# Patient Record
Sex: Male | Born: 1975 | Race: White | Hispanic: No
Health system: Southern US, Community
[De-identification: ages and names within clinical notes are randomized; demographics above are authoritative.]

## PROBLEM LIST (undated history)

## (undated) DIAGNOSIS — T7840XA Allergy, unspecified, initial encounter: Secondary | ICD-10-CM

## (undated) DIAGNOSIS — J189 Pneumonia, unspecified organism: Secondary | ICD-10-CM

## (undated) DIAGNOSIS — B070 Plantar wart: Secondary | ICD-10-CM

## (undated) DIAGNOSIS — I10 Essential (primary) hypertension: Secondary | ICD-10-CM

## (undated) DIAGNOSIS — Z8669 Personal history of other diseases of the nervous system and sense organs: Secondary | ICD-10-CM

## (undated) DIAGNOSIS — E782 Mixed hyperlipidemia: Secondary | ICD-10-CM

## (undated) HISTORY — DX: Personal history of other diseases of the nervous system and sense organs: Z86.69

## (undated) HISTORY — DX: Allergy, unspecified, initial encounter: T78.40XA

## (undated) HISTORY — DX: Pneumonia, unspecified organism: J18.9

## (undated) HISTORY — PX: SHOULDER SURGERY: SHX246

## (undated) HISTORY — PX: CLAVICLE SURGERY: SHX598

## (undated) HISTORY — PX: NASAL FRACTURE SURGERY: SHX718

## (undated) HISTORY — DX: Mixed hyperlipidemia: E78.2

## (undated) HISTORY — DX: Plantar wart: B07.0

## (undated) HISTORY — PX: NOSE SURGERY: SHX723

---

## 2011-09-12 ENCOUNTER — Emergency Department
Admission: EM | Admit: 2011-09-12 | Discharge: 2011-09-12 | Disposition: A | Discharge status: Home / Self Care | Payer: 59 | Source: Home / Self Care | Attending: Emergency Medicine | Admitting: Emergency Medicine

## 2011-09-12 DIAGNOSIS — J329 Chronic sinusitis, unspecified: Secondary | ICD-10-CM

## 2011-09-12 DIAGNOSIS — J069 Acute upper respiratory infection, unspecified: Secondary | ICD-10-CM

## 2011-09-12 HISTORY — DX: Essential (primary) hypertension: I10

## 2011-09-12 MED ORDER — AZITHROMYCIN 250 MG PO TABS: ORAL_TABLET | ORAL | Status: AC

## 2011-09-12 MED ORDER — METHYLPREDNISOLONE SODIUM SUCC 125 MG IJ SOLR
125.0000 mg | Freq: Once | INTRAMUSCULAR | Status: AC
  Administered 2011-09-12: 125 mg via INTRAMUSCULAR

## 2011-09-12 NOTE — ED Provider Notes (Signed)
And a History     CSN: [id number]  Arrival date & time 09/12/11  1539   First MD Initiated Contact with Patient 09/12/11 1546      Chief Complaint  Patient presents with  . Facial Pain    (Consider location/radiation/quality/duration/timing/severity/associated sxs/prior treatment) HPI Albert Knight is a 36 y.o. male who complains of onset of cold symptoms for 10 days.  The symptoms are constant and mild-moderate in severity.  About 6 months ago he had similar symptoms and was prescribed Ceftin and developed a colitis. No sore throat + cough No pleuritic pain No wheezing + nasal congestion + post-nasal drainage ++ R sided sinus pain/pressure No chest congestion No itchy/red eyes No earache No hemoptysis No SOB No chills/sweats No fever No nausea No vomiting No abdominal pain No diarrhea No skin rashes No fatigue No myalgias + headache    Past Medical History  Diagnosis Date  . Hypertension     Past Surgical History  Procedure Date  . Shoulder surgery     No family history on file.  History  Substance Use Topics  . Smoking status: Not on file  . Smokeless tobacco: Not on file  . Alcohol Use:       Review of Systems  All other systems reviewed and are negative.    Allergies  Review of patient's allergies indicates no known allergies.  Home Medications   Current Outpatient Rx  Name Route Sig Dispense Refill  . AZITHROMYCIN 250 MG PO TABS  Use as directed 1 each 0    BP 140/86  Pulse 92  Temp(Src) 98.2 F (36.8 C) (Oral)  Resp 18  Ht 5\' 9"  (1.753 m)  Wt 224 lb (101.606 kg)  BMI 33.08 kg/m2  SpO2 97%  Physical Exam  Nursing note and vitals reviewed. Constitutional: He is oriented to person, place, and time. He appears well-developed and well-nourished.  HENT:  Head: Normocephalic and atraumatic.  Right Ear: Tympanic membrane, external ear and ear canal normal.  Left Ear: Tympanic membrane, external ear and ear canal normal.  Nose:  Mucosal edema and rhinorrhea present.  Mouth/Throat: Posterior oropharyngeal erythema present. No oropharyngeal exudate or posterior oropharyngeal edema.  Eyes: No scleral icterus.  Neck: Neck supple.  Cardiovascular: Regular rhythm and normal heart sounds.   Pulmonary/Chest: Effort normal and breath sounds normal. No respiratory distress.  Neurological: He is alert and oriented to person, place, and time.  Skin: Skin is warm and dry.  Psychiatric: He has a normal mood and affect. His speech is normal.    ED Course  Procedures (including critical care time)  Labs Reviewed - No data to display No results found.   1. Sinusitis   2. Acute upper respiratory infections of unspecified site       MDM  1)  Take the prescribed antibiotic as instructed.  Due to his history of colitis, I would prefer to treat him first with a shot of Solu-Medrol and over-the-counter cough and cold medicines.  It is not getting better, I gave her prescription for Z-Pak that he can use.  He has been on a Z-Pak in the past and has not had any side effects. 2)  Use nasal saline solution (over the counter) at least 3 times a day. 3)  Use over the counter decongestants like Zyrtec-D every 12 hours as needed to help with congestion.  If you have hypertension, do not take medicines with sudafed.  4)  Can take tylenol every 6 hours or motrin  every 8 hours for pain or fever. 5)  Follow up with your primary doctor if no improvement in 5-7 days, sooner if increasing pain, fever, or new symptoms.        Marlaine Hind, MD 09/12/11 [phone]

## 2011-09-12 NOTE — ED Notes (Signed)
States sinus pressure and HA for a week and a half worse yesterday

## 2011-09-14 ENCOUNTER — Telehealth: Payer: Self-pay | Admitting: *Deleted

## 2011-09-14 NOTE — ED Notes (Signed)
Patient called requesting work excuse. He was seen 3 days ago for a sinusitis. He reports flushing and diaphoresis on day 2 but denies current flushing or fever. He is requesting to be excused from work through today, 09/14/11. I told him I could excuse him for the day he was seen and yesterday 09/13/11. He asked for today off as well, I asked him why he needed to be out today and he reported "my nose is running a lot and it's making the outside of my nose raw". I denied the excuse for today but faxed a work excuse for 09/12/11 - 09/13/11 to [phone] per patient request.

## 2013-04-05 ENCOUNTER — Ambulatory Visit (INDEPENDENT_AMBULATORY_CARE_PROVIDER_SITE_OTHER): Payer: 59 | Admitting: Family Medicine

## 2013-04-05 ENCOUNTER — Encounter: Payer: Self-pay | Admitting: Family Medicine

## 2013-04-05 VITALS — BP 137/82 | HR 71 | Temp 97.4°F | Ht 68.5 in | Wt 223.0 lb

## 2013-04-05 DIAGNOSIS — J302 Other seasonal allergic rhinitis: Secondary | ICD-10-CM

## 2013-04-05 DIAGNOSIS — R599 Enlarged lymph nodes, unspecified: Secondary | ICD-10-CM

## 2013-04-05 DIAGNOSIS — J309 Allergic rhinitis, unspecified: Secondary | ICD-10-CM

## 2013-04-05 DIAGNOSIS — R591 Generalized enlarged lymph nodes: Secondary | ICD-10-CM | POA: Insufficient documentation

## 2013-04-05 LAB — CBC WITH DIFFERENTIAL/PLATELET
Basophils Absolute: 0 10*3/uL (ref 0.0–0.1)
Basophils Relative: 0 % (ref 0–1)
Eosinophils Absolute: 0.1 10*3/uL (ref 0.0–0.7)
Eosinophils Relative: 1 % (ref 0–5)
HCT: 44.6 % (ref 39.0–52.0)
Hemoglobin: 15.7 g/dL (ref 13.0–17.0)
Lymphocytes Relative: 27 % (ref 12–46)
Lymphs Abs: 1.6 10*3/uL (ref 0.7–4.0)
MCH: 31.2 pg (ref 26.0–34.0)
MCHC: 35.2 g/dL (ref 30.0–36.0)
MCV: 88.5 fL (ref 78.0–100.0)
Monocytes Absolute: 0.6 10*3/uL (ref 0.1–1.0)
Monocytes Relative: 9 % (ref 3–12)
Neutro Abs: 3.8 10*3/uL (ref 1.7–7.7)
Neutrophils Relative %: 63 % (ref 43–77)
Platelets: 294 10*3/uL (ref 150–400)
RBC: 5.04 MIL/uL (ref 4.22–5.81)
RDW: 13.4 % (ref 11.5–15.5)
WBC: 6.1 10*3/uL (ref 4.0–10.5)

## 2013-04-05 MED ORDER — FLUTICASONE PROPIONATE 50 MCG/ACT NA SUSP: 2.0000 | Freq: Every day | NASAL | Status: DC

## 2013-04-05 NOTE — Progress Notes (Signed)
CC: Albert Knight is a 37 y.o. male is here for Establish Care   Subjective: HPI:  Requesting refill on nasal steroid he has been taking all through the fall does this on an annual basis. Treat symptoms including nasal congestion clear nasal discharge and itching of the nasal cavity. Symptoms are well controlled on fluticasone.  Complains of a mass in the left groin that has been present at least since Monday when he first noticed it. It is slightly tender and becomes more tender the more he manipulates it he is unsure about describing the size but does not feel that it has been enlarging. Denies any other known masses. Denies any current skin infections or genitourinary complaints nor family or personal history of colon cancer or colitis. Denies unintentional weight loss, fevers, chills, night sweats, nor trouble fighting infections. Denies bruising or bleeding abnormalities.   Review of Systems - General ROS: negative for - chills, fever, night sweats, weight gain or weight loss Ophthalmic ROS: negative for - decreased vision Psychological ROS: negative for - anxiety or depression ENT ROS: negative for - hearing change, nasal congestion, tinnitus or allergies Hematological and Lymphatic ROS: negative for - bleeding problems, bruising Breast ROS: negative Respiratory ROS: no cough, shortness of breath, or wheezing Cardiovascular ROS: no chest pain or dyspnea on exertion Gastrointestinal ROS: no abdominal pain, change in bowel habits, or black or bloody stools Genito-Urinary ROS: negative for - genital discharge, genital ulcers, incontinence or abnormal bleeding from genitals Musculoskeletal ROS: negative for - joint pain or muscle pain Neurological ROS: negative for - headaches or memory loss Dermatological ROS: negative for lumps, mole changes, rash and skin lesion changes  Past Medical History  Diagnosis Date  . Hypertension      No family history on file.   History  Substance Use  Topics  . Smoking status: Never Smoker   . Smokeless tobacco: Not on file  . Alcohol Use: Not on file     Objective: Filed Vitals:   04/05/13 1017  BP: 137/82  Pulse: 71  Temp: 97.4 F (36.3 C)    General: Alert and Oriented, No Acute Distress HEENT: Pupils equal, round, reactive to light. Conjunctivae clear.   Moist mucous membranes pharynx unremarkable no cervical lymphadenopathy nor supraclavicular palpable lymphadenopathy  Lungs: Clear to auscultation bilaterally, no wheezing/ronchi/rales.  Comfortable work of breathing. Good air movement. Cardiac: Regular rate and rhythm. Normal S1/S2.  No murmurs, rubs, nor gallops.   Genitourinary: Unremarkable scrotum and testes, right inguinal region unremarkable left inguinal region with a approximately half centimeter slightly tender superficial fixed nodule.  Without overlying or nearby skin changes. Mental Status: No depression, anxiety, nor agitation. Skin: Warm and dry.  Assessment & Plan: Jahni was seen today for establish care.  Diagnoses and associated orders for this visit:  Seasonal allergies - fluticasone (FLONASE) 50 MCG/ACT nasal spray; Place 2 sprays into both nostrils daily.  Lymphadenopathy - CBC with Differential    Discussed with patient that this is most likely benign lymph node however he seems quite anxious about it, routinely get CBC with differential however will offer ultrasound for better characterization of  Anatomy following blood work. Refills provided for fluticasone, controlled seasonal allergies   Return if symptoms worsen or fail to improve.

## 2013-04-06 ENCOUNTER — Telehealth: Payer: Self-pay | Admitting: Family Medicine

## 2013-04-06 ENCOUNTER — Ambulatory Visit (INDEPENDENT_AMBULATORY_CARE_PROVIDER_SITE_OTHER): Payer: 59

## 2013-04-06 DIAGNOSIS — R1909 Other intra-abdominal and pelvic swelling, mass and lump: Secondary | ICD-10-CM

## 2013-04-06 DIAGNOSIS — R1900 Intra-abdominal and pelvic swelling, mass and lump, unspecified site: Secondary | ICD-10-CM

## 2013-04-06 NOTE — Telephone Encounter (Signed)
Pt notified. Pt wanted to see if he could get his u/s done this afternoon.Carollee Herter from [facility] is going to call pt to schedule this afternoon

## 2013-04-06 NOTE — Telephone Encounter (Signed)
[name], Will you please let Mr. Albert Knight know that his blood cell counts were all normal.  I've placed an order for an ultrasound of his groin to confirm that his mass is a benign lymph node.  He should be gettting a call about scheduling in the next few days.

## 2013-05-03 ENCOUNTER — Encounter: Payer: Self-pay | Admitting: Family Medicine

## 2013-12-03 ENCOUNTER — Encounter: Payer: Self-pay | Admitting: Family Medicine

## 2013-12-03 ENCOUNTER — Ambulatory Visit (INDEPENDENT_AMBULATORY_CARE_PROVIDER_SITE_OTHER): Payer: 59 | Admitting: Family Medicine

## 2013-12-03 VITALS — BP 139/79 | HR 56 | Temp 98.1°F | Wt 196.0 lb

## 2013-12-03 DIAGNOSIS — H66001 Acute suppurative otitis media without spontaneous rupture of ear drum, right ear: Secondary | ICD-10-CM

## 2013-12-03 DIAGNOSIS — H66009 Acute suppurative otitis media without spontaneous rupture of ear drum, unspecified ear: Secondary | ICD-10-CM

## 2013-12-03 DIAGNOSIS — J029 Acute pharyngitis, unspecified: Secondary | ICD-10-CM

## 2013-12-03 MED ORDER — AZITHROMYCIN 250 MG PO TABS: ORAL_TABLET | ORAL | Status: AC

## 2013-12-03 NOTE — Progress Notes (Signed)
CC: Albert Knight is a 38 y.o. male is here for Sore Throat   Subjective: HPI:  Complains of sore throat, facial pressure beneath both eyes, nasal congestion, nonproductive cough and right ear pain that has been present since Wednesday of last week worsening on a daily basis. Slightly better with NyQuil or DayQuil, nothing else seems to make it better. Sick contacts at home including strep throat, viral upper respiratory illnesses. Symptoms are present all hours of the day. Denies fevers, chills, wheezing, shortness of breath, motor sensory disturbances   Review Of Systems Outlined In HPI  Past Medical History  Diagnosis Date  . Hypertension     Past Surgical History  Procedure Laterality Date  . Shoulder surgery     Family History  Problem Relation Age of Onset  . Hypertension Mother   . Hypertension Father   . Hyperlipidemia Mother   . Hyperlipidemia Father     History   Social History  . Marital Status: Married    Spouse Name: N/A    Number of Children: N/A  . Years of Education: N/A   Occupational History  . Not on file.   Social History Main Topics  . Smoking status: Never Smoker   . Smokeless tobacco: Not on file  . Alcohol Use: Not on file  . Drug Use: No  . Sexual Activity: Not on file   Other Topics Concern  . Not on file   Social History Narrative  . No narrative on file     Objective: BP 139/79  Pulse 56  Temp(Src) 98.1 F (36.7 C) (Oral)  Wt 196 lb (88.905 kg)  General: Alert and Oriented, No Acute Distress HEENT: Pupils equal, round, reactive to light. Conjunctivae clear.  External ears unremarkable, canals clear with intact TMs with appropriate landmarks however right tympanic membrane moderately erythematous.  Left Middle ear appears open without effusion right middle ear with opaque fluid. Pink inferior turbinates.  Moist mucous membranes, pharynx without inflammation nor lesions.  Neck supple without palpable lymphadenopathy nor abnormal  masses. Lungs: Clear to auscultation bilaterally, no wheezing/ronchi/rales.  Comfortable work of breathing. Good air movement. Extremities: No peripheral edema.  Strong peripheral pulses.  Mental Status: No depression, anxiety, nor agitation. Skin: Warm and dry.  Assessment & Plan: Albert Knight was seen today for sore throat.  Diagnoses and associated orders for this visit:  Acute pharyngitis, unspecified pharyngitis type - POCT rapid strep A - azithromycin (ZITHROMAX) 250 MG tablet; Take two tabs at once on day 1, then one tab daily on days 2-5.  Acute suppurative otitis media of right ear without spontaneous rupture of tympanic membrane, recurrence not specified    Strep test negative, exam suggestive of right otitis media likely due to an initial viral upper respiratory illness, start azithromycin since he reports colitis when he took what appeared to be either amoxicillin or cephalosporin in the past   Return if symptoms worsen or fail to improve.

## 2014-01-25 ENCOUNTER — Ambulatory Visit: Payer: 59 | Admitting: Family Medicine

## 2014-01-25 ENCOUNTER — Telehealth: Payer: Self-pay

## 2014-01-28 ENCOUNTER — Ambulatory Visit: Payer: 59 | Admitting: Family Medicine

## 2014-01-30 NOTE — Telephone Encounter (Signed)
Opened in error

## 2014-05-28 ENCOUNTER — Other Ambulatory Visit: Payer: Self-pay | Admitting: Family Medicine

## 2014-11-29 ENCOUNTER — Other Ambulatory Visit: Payer: Self-pay | Admitting: Family Medicine

## 2014-11-29 MED ORDER — FLUTICASONE PROPIONATE 50 MCG/ACT NA SUSP: 2.0000 | Freq: Every day | NASAL | Status: DC

## 2015-02-06 ENCOUNTER — Telehealth: Payer: Self-pay | Admitting: *Deleted

## 2015-02-06 ENCOUNTER — Encounter: Payer: Self-pay | Admitting: Osteopathic Medicine

## 2015-02-06 ENCOUNTER — Ambulatory Visit (INDEPENDENT_AMBULATORY_CARE_PROVIDER_SITE_OTHER): Payer: 59 | Admitting: Osteopathic Medicine

## 2015-02-06 ENCOUNTER — Telehealth: Payer: Self-pay

## 2015-02-06 VITALS — BP 130/78 | HR 46 | Temp 98.0°F | Ht 68.5 in | Wt 207.0 lb

## 2015-02-06 DIAGNOSIS — H9202 Otalgia, left ear: Secondary | ICD-10-CM | POA: Diagnosis not present

## 2015-02-06 DIAGNOSIS — H6992 Unspecified Eustachian tube disorder, left ear: Secondary | ICD-10-CM

## 2015-02-06 MED ORDER — ANTIPYRINE-BENZOCAINE 5.4-1.4 % OT SOLN: 3.0000 [drp] | OTIC | Status: DC | PRN

## 2015-02-06 NOTE — Telephone Encounter (Signed)
Called pt to inform them that that the rx for antipyrine-benzocaine (AURALGAN) otic solution in not available and that they should return to the office is ear pain get worse pre Dr. Lyn HollingsheadAlexander. No answer. No vm. Will call back later.

## 2015-02-06 NOTE — Progress Notes (Signed)
HPI: Albert Knight is a 39 y.o. male who [facility]  today for chief complaint of:  Chief Complaint  Patient presents with  . left ear pain    since Tues    . Location: L ear . Quality: sorenedd . Severity: moderate . Duration: 2 days, better today thany esterday . Assoc signs/symptoms: some sinus pressure on L, no visoin changes, no pain with chewing   Past medical, social and family history reviewed: Past Medical History  Diagnosis Date  . Hypertension    Past Surgical History  Procedure Laterality Date  . Shoulder surgery     Social History  Substance Use Topics  . Smoking status: Never Smoker   . Smokeless tobacco: Not on file  . Alcohol Use: Not on file   Family History  Problem Relation Age of Onset  . Hypertension Mother   . Hypertension Father   . Hyperlipidemia Mother   . Hyperlipidemia Father     Current Outpatient Prescriptions  Medication Sig Dispense Refill  . fluticasone (FLONASE) 50 MCG/ACT nasal spray Place 2 sprays into both nostrils daily. Follow up appointment needed for future refills. 16 g 0   No current facility-administered medications for this visit.   No Known Allergies    Review of Systems: CONSTITUTIONAL: Neg fever/chills, no unintentional weight changes HEAD/EYES/EARS/NOSE/THROAT: No headache/vision change or hearing change, no sore throat, (+) sinus pressure as per HPI RESPIRATORY: No cough/shortness of breath/wheeze MUSCULOSKELETAL: No myalgia/arthralgia NEUROLOGIC: No weakness/dizzines   Exam:  BP 130/78 mmHg  Pulse 46  Temp(Src) 98 F (36.7 C) (Oral)  Ht 5' 8.5" (1.74 m)  Wt 207 lb (93.895 kg)  BMI 31.01 kg/m2 Constitutional: VSS, see above. General Appearance: alert, well-developed, well-nourished, NAD Eyes: Normal lids and conjunctive, non-icteric sclera, PERRLA Ears, Nose, Mouth, Throat: Normal external inspection ears/nares/mouth/lips/gums, Normal TM bilaterally except  mild scarring but membranes are intact, MMM, posterior pharynx without erythema/exudate, normal TMJ movement bilaterally, mild tender sinuses maxillary on L Neck: No masses, trachea midline. No thyroid enlargement/tenderness/mass appreciated   No results found for this or any previous visit (from the past 72 hour(s)).    ASSESSMENT/PLAN:  Left ear pain - Plan: antipyrine-benzocaine (AURALGAN) otic solution  Eustachian tube disorder, left   Advised to like an infection, though may be working on "viral sinusitis. Advised continue Flonase, antihistamines, decongestants over-the-counter. Advised on symptoms to watch for, worsening pain, fever chills, worsening vision changes. Patient advised no need for antibiotics at this time but let us know if he is not feeling any better. Advised over-the-counter Tylenol/concerning for aches/pains.

## 2015-02-06 NOTE — Telephone Encounter (Signed)
Pt came into the office and states the ear drops that were rx'd earlier this am are on back order, and won't be available for six months. Pt needs another rx sent in please

## 2015-02-06 NOTE — Patient Instructions (Signed)
Recommend continue Flonase, add antihistamine (Claritin, Zyrtec, Benadryl) with decongestant (Claritin-D, Zyrtec-D, or add Sudafed to the antihistamine). Ear drops as needed for pain. If fever/chills, worse ear pain or ear drainage, any worsening of pain happens please come back to clinic for further evaluation.

## 2015-02-06 NOTE — Telephone Encounter (Signed)
Made another attempt to call pt regarding this. See phone note.

## 2015-02-06 NOTE — Telephone Encounter (Signed)
[facility] called notifying me that the rx for antipyrine-benzocaine (AURALGAN) otic solution is on backorder. I called several other pharmacies in the area and the all informed me of the same thing. Called [facility] and they informed me that antipyrine-benzocaine (AURALGAN) otic solution has been taken off the market due to ineffectiveness.

## 2015-02-06 NOTE — Telephone Encounter (Signed)
Please call patient: no alternative ear drop, we have already called several pharmacies. Can use Ibuprofen or Tylenol as discussed in the office. Return to clinic if ear is worse. Thanks.

## 2015-02-07 NOTE — Telephone Encounter (Signed)
Pt has been notified.

## 2015-02-07 NOTE — Telephone Encounter (Signed)
Pt notified and advised to take otc ibuprofen for pain and return to the office if he feels it necessary.

## 2015-03-26 ENCOUNTER — Encounter: Payer: Self-pay | Admitting: Family Medicine

## 2015-03-26 ENCOUNTER — Ambulatory Visit (INDEPENDENT_AMBULATORY_CARE_PROVIDER_SITE_OTHER): Payer: 59 | Admitting: Family Medicine

## 2015-03-26 VITALS — BP 125/87 | HR 53 | Wt 207.0 lb

## 2015-03-26 DIAGNOSIS — A499 Bacterial infection, unspecified: Secondary | ICD-10-CM

## 2015-03-26 DIAGNOSIS — J329 Chronic sinusitis, unspecified: Secondary | ICD-10-CM

## 2015-03-26 DIAGNOSIS — B9689 Other specified bacterial agents as the cause of diseases classified elsewhere: Secondary | ICD-10-CM

## 2015-03-26 MED ORDER — AMOXICILLIN-POT CLAVULANATE 500-125 MG PO TABS: ORAL_TABLET | ORAL | Status: AC

## 2015-03-26 NOTE — Progress Notes (Signed)
CC: Albert Knight is a 39 y.o. male is here for Sinusitis   Subjective: HPI:  Nasal congestion, facial pressure in the forehead and cheeks, postnasal drip present since Wednesday of last week. Slowly worsening. Symptoms are worse at night. No benefit from nasal saline washes or Flonase. Also has occasional nosebleed. Denies any cough, fever, chills, wheezing, bleeding elsewhere. Denies any bruising problems. Denies any confusion or headache.   Review Of Systems Outlined In HPI  Past Medical History  Diagnosis Date  . Hypertension     Past Surgical History  Procedure Laterality Date  . Shoulder surgery     Family History  Problem Relation Age of Onset  . Hypertension Mother   . Hypertension Father   . Hyperlipidemia Mother   . Hyperlipidemia Father     Social History   Social History  . Marital Status: Married    Spouse Name: N/A  . Number of Children: N/A  . Years of Education: N/A   Occupational History  . Not on file.   Social History Main Topics  . Smoking status: Never Smoker   . Smokeless tobacco: Not on file  . Alcohol Use: Not on file  . Drug Use: No  . Sexual Activity: Not on file   Other Topics Concern  . Not on file   Social History Narrative     Objective: BP 125/87 mmHg  Pulse 53  Wt 207 lb (93.895 kg)  General: Alert and Oriented, No Acute Distress HEENT: Pupils equal, round, reactive to light. Conjunctivae clear.  External ears unremarkable, canals clear with intact TMs with appropriate landmarks.  Middle ear appears open without effusion. Pink inferior turbinates with no blood vessel seen on the septum..  Moist mucous membranes, pharynx without inflammation nor lesions.  Neck supple without palpable lymphadenopathy nor abnormal masses. Lungs: Clear to auscultation bilaterally, no wheezing/ronchi/rales.  Comfortable work of breathing. Good air movement. Extremities: No peripheral edema.  Strong peripheral pulses.  Mental Status: No depression,  anxiety, nor agitation. Skin: Warm and dry.  Assessment & Plan: Albert Knight was seen today for sinusitis.  Diagnoses and all orders for this visit:  Bacterial sinusitis -     amoxicillin-clavulanate (AUGMENTIN) 500-125 MG tablet; Take one by mouth every 8 hours for ten total days.   Start Augmentin, encouraged to stop Flonase until 10 days of antibiotics are complete. Continue nasal saline washes. Consider humidifier.  No Follow-up on file.

## 2015-06-05 ENCOUNTER — Other Ambulatory Visit: Payer: Self-pay

## 2015-06-05 MED ORDER — FLUTICASONE PROPIONATE 50 MCG/ACT NA SUSP: 2.0000 | Freq: Every day | NASAL | Status: DC

## 2015-06-18 ENCOUNTER — Encounter: Payer: Self-pay | Admitting: Family Medicine

## 2015-06-18 ENCOUNTER — Ambulatory Visit (INDEPENDENT_AMBULATORY_CARE_PROVIDER_SITE_OTHER): Payer: 59 | Admitting: Family Medicine

## 2015-06-18 VITALS — BP 125/85 | HR 60 | Wt 217.0 lb

## 2015-06-18 DIAGNOSIS — Z0000 Encounter for general adult medical examination without abnormal findings: Secondary | ICD-10-CM

## 2015-06-18 DIAGNOSIS — Z23 Encounter for immunization: Secondary | ICD-10-CM | POA: Diagnosis not present

## 2015-06-18 DIAGNOSIS — M545 Low back pain: Secondary | ICD-10-CM

## 2015-06-18 LAB — COMPREHENSIVE METABOLIC PANEL
ALK PHOS: 44 U/L (ref 40–115)
ALT: 21 U/L (ref 9–46)
AST: 25 U/L (ref 10–40)
Albumin: 4.4 g/dL (ref 3.6–5.1)
BILIRUBIN TOTAL: 0.7 mg/dL (ref 0.2–1.2)
BUN: 16 mg/dL (ref 7–25)
CALCIUM: 9.3 mg/dL (ref 8.6–10.3)
CO2: 25 mmol/L (ref 20–31)
CREATININE: 0.97 mg/dL (ref 0.60–1.35)
Chloride: 106 mmol/L (ref 98–110)
GLUCOSE: 84 mg/dL (ref 65–99)
Potassium: 4.3 mmol/L (ref 3.5–5.3)
SODIUM: 140 mmol/L (ref 135–146)
Total Protein: 6.7 g/dL (ref 6.1–8.1)

## 2015-06-18 LAB — CBC
HCT: 43.3 % (ref 39.0–52.0)
Hemoglobin: 14.9 g/dL (ref 13.0–17.0)
MCH: 30 pg (ref 26.0–34.0)
MCHC: 34.4 g/dL (ref 30.0–36.0)
MCV: 87.3 fL (ref 78.0–100.0)
MPV: 9.3 fL (ref 8.6–12.4)
PLATELETS: 216 10*3/uL (ref 150–400)
RBC: 4.96 MIL/uL (ref 4.22–5.81)
RDW: 13.9 % (ref 11.5–15.5)
WBC: 3.8 10*3/uL — ABNORMAL LOW (ref 4.0–10.5)

## 2015-06-18 LAB — VITAMIN B12: VITAMIN B 12: 737 pg/mL (ref 200–1100)

## 2015-06-18 LAB — LIPID PANEL
CHOLESTEROL: 195 mg/dL (ref 125–200)
HDL: 57 mg/dL (ref >=40)
LDL Cholesterol: 120 mg/dL (ref <130)
Total CHOL/HDL Ratio: 3.4 Ratio (ref <=5.0)
Triglycerides: 91 mg/dL (ref <150)
VLDL: 18 mg/dL (ref <30)

## 2015-06-18 NOTE — Progress Notes (Signed)
CC: Albert Knight is a 40 y.o. male is here for Annual Exam; URI; and Back Pain   Subjective: HPI:  Colonoscopy: No current indication Prostate: Discussed screening risks/beneifts with patient today, no current indication  Influenza Vaccine: Up-to-date Pneumovax: No current indication Td/Tdap: Will receive booster today Zoster: (Start 41 yo)  Requesting complete physical exam. 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 Worse with sitting at his desk. Denies any radiation or any motor or sensory disturbances. He's had this for decades. Has not been getting better or worse since onset. Improved with chiropractor visits.  Review of Systems - General ROS: negative for - chills, fever, night sweats, weight gain or weight loss Ophthalmic ROS: negative for - decreased vision Psychological ROS: negative for - anxiety or depression ENT ROS: negative for - hearing change, nasal congestion, tinnitus or allergies Hematological and Lymphatic ROS: negative for - bleeding problems, bruising or swollen lymph nodes Breast ROS: negative Respiratory ROS: no cough, shortness of breath, or wheezing Cardiovascular ROS: no chest pain or dyspnea on exertion Gastrointestinal ROS: no abdominal pain, change in bowel habits, or black or bloody stools Genito-Urinary ROS: negative for - genital discharge, genital ulcers, incontinence or abnormal bleeding from genitals Musculoskeletal ROS: negative for - joint pain or muscle pain Neurological ROS: negative for - headaches or memory loss Dermatological ROS: negative for lumps, mole changes, rash and skin lesion changes  Past Medical History  Diagnosis Date  . Hypertension     Past Surgical History  Procedure Laterality Date  . Shoulder surgery     Family History  Problem Relation Age of Onset  . Hypertension Mother   . Hypertension Father   . Hyperlipidemia Mother   . Hyperlipidemia Father     Social History   Social History  . Marital Status: Married     Spouse Name: N/A  . Number of Children: N/A  . Years of Education: N/A   Occupational History  . Not on file.   Social History Main Topics  . Smoking status: Never Smoker   . Smokeless tobacco: Not on file  . Alcohol Use: Not on file  . Drug Use: No  . Sexual Activity: Not on file   Other Topics Concern  . Not on file   Social History Narrative     Objective: BP 125/85 mmHg  Pulse 60  Wt 217 lb (98.431 kg) General: No Acute Distress HEENT: Atraumatic, normocephalic, conjunctivae normal without scleral icterus.  No nasal discharge, hearing grossly intact, TMs with good landmarks bilaterally with no middle ear abnormalities, posterior pharynx clear without oral lesions. Neck: Supple, trachea midline, no cervical nor supraclavicular adenopathy. Pulmonary: Clear to auscultation bilaterally without wheezing, rhonchi, nor rales. Cardiac: Regular rate and rhythm.  No murmurs, rubs, nor gallops. No peripheral edema.  2+ peripheral pulses bilaterally. Abdomen: Bowel sounds normal.  No masses.  Non-tender without rebound.  Negative Murphy's sign. MSK: Grossly intact, no signs of weakness.  Full strength throughout upper and lower extremities.  Full ROM in upper and lower extremities.  No midline spinal tenderness. straightening of the lumbar spine, lack of lordosis  Neuro: Gait unremarkable, CN II-XII grossly intact.  C5-C6 Reflex 2/4 Bilaterally, L4 Reflex 2/4 Bilaterally.  Cerebellar function intact. Skin: No rashes. Psych: Alert and oriented to person/place/time.  Thought process normal. No anxiety/depression. Assessment & Plan: [name] was seen today for annual exam, uri and back pain.  Diagnoses and all orders for this visit:  Annual physical exam -  Lipid panel -     Comprehensive Metabolic Panel (CMET) -     CBC -     Vitamin B12 -     Tdap vaccine greater than or equal to 7yo IM  Low back pain without sciatica, unspecified back pain laterality   Healthy lifestyle  interventions including but not limited to regular exercise, a healthy low fat diet, moderation of salt intake, the dangers of tobacco/alcohol/recreational drug use, nutrition supplementation, and accident avoidance were discussed with the patient and a handout was provided for future reference.  Encouragd to get a lumbar foam support for his desk chair.  Return in about 1 year (around 06/17/2016).

## 2015-06-20 ENCOUNTER — Telehealth: Payer: Self-pay

## 2015-06-20 MED ORDER — AMOXICILLIN-POT CLAVULANATE 500-125 MG PO TABS: ORAL_TABLET | ORAL | Status: AC

## 2015-06-20 MED FILL — AMOX-CLAV 500-125 MG TABLET: 500-125 | 10 days supply | Qty: 30 | Fill #0

## 2015-06-20 NOTE — Telephone Encounter (Signed)
[place], Rx placed in in-box ready for pickup/faxing. Printed since I'm not sure which pharmacy he wants.

## 2015-06-20 NOTE — Telephone Encounter (Signed)
Pt stated that he is still not feeling better.  Please advise.

## 2015-06-20 NOTE — Telephone Encounter (Signed)
Rx faxed

## 2016-01-13 MED FILL — AZITHROMYCIN 250 MG TABLET: 250 | 5 days supply | Qty: 6 | Fill #0

## 2016-05-24 ENCOUNTER — Telehealth: Payer: Self-pay | Admitting: Family Medicine

## 2016-05-24 MED ORDER — OSELTAMIVIR PHOSPHATE 75 MG PO CAPS: 75.0000 mg | ORAL_CAPSULE | Freq: Every day | ORAL | 0 refills | Status: DC

## 2016-05-24 MED FILL — OSELTAMIVIR PHOS 75 MG CAP: 75 | 10 days supply | Qty: 10 | Fill #0

## 2016-05-24 NOTE — Telephone Encounter (Signed)
Patient doesn't have symptoms. Both his kids was diagnosed with the flu. Sent in Tamiflu to [facility].

## 2016-05-24 NOTE — Telephone Encounter (Signed)
Please send script to [facility].  Thank you.

## 2016-05-24 NOTE — Telephone Encounter (Signed)
Pt called. His kids tested positive for flu and both were given Tamaflu. Pt would like Tamaflu as well, his symptoms are runny nose & scratchy throat.

## 2016-07-26 MED FILL — AZITHROMYCIN 250 MG TABLET: 250 | 5 days supply | Qty: 6 | Fill #0

## 2016-12-23 ENCOUNTER — Telehealth: Payer: Self-pay | Admitting: *Deleted

## 2016-12-23 ENCOUNTER — Other Ambulatory Visit: Payer: Self-pay | Admitting: *Deleted

## 2016-12-23 MED ORDER — FLUTICASONE PROPIONATE 50 MCG/ACT NA SUSP: 2.0000 | Freq: Every day | NASAL | 0 refills | Status: DC

## 2016-12-23 NOTE — Telephone Encounter (Signed)
Pt called requesting a refill for his flonase. I explained to him that he has not been seen here at our office since 05/2015 and would need to come in to be seen to get established with a provider. Pt stated that he is pretty healthy and that his employer does his annual physical and labs so there is really no reason for him to come in to be seen. I told him that this medication can be purchased OTC and that if I were to send it to his mail order it would cost more since it would be a 30 day supply. I strongly advised pt to establish care with a provider he declined at this time and requested that the rx be sent. I told him that I would do it this time however this would be the only time this could be done.Loralee PacasBarkley, Ambree Frances Gene AutryLynetta

## 2017-03-19 ENCOUNTER — Encounter: Payer: Self-pay | Admitting: Emergency Medicine

## 2017-03-19 ENCOUNTER — Emergency Department (INDEPENDENT_AMBULATORY_CARE_PROVIDER_SITE_OTHER)
Admission: EM | Admit: 2017-03-19 | Discharge: 2017-03-19 | Disposition: A | Discharge status: Home / Self Care | Payer: 59 | Source: Home / Self Care | Attending: Family Medicine | Admitting: Family Medicine

## 2017-03-19 ENCOUNTER — Other Ambulatory Visit: Payer: Self-pay

## 2017-03-19 ENCOUNTER — Emergency Department (INDEPENDENT_AMBULATORY_CARE_PROVIDER_SITE_OTHER): Payer: 59

## 2017-03-19 DIAGNOSIS — J181 Lobar pneumonia, unspecified organism: Secondary | ICD-10-CM | POA: Diagnosis not present

## 2017-03-19 DIAGNOSIS — J189 Pneumonia, unspecified organism: Secondary | ICD-10-CM

## 2017-03-19 LAB — POCT CBC W AUTO DIFF (K'VILLE URGENT CARE)

## 2017-03-19 MED ORDER — AZITHROMYCIN 250 MG PO TABS: ORAL_TABLET | ORAL | 0 refills | Status: DC

## 2017-03-19 NOTE — Discharge Instructions (Addendum)
Take plain guaifenesin (1200mg extended release tabs such as Mucinex) twice daily, with plenty of water, for cough and congestion.  May add Pseudoephedrine (30mg, one or two every 4 to 6 hours) for sinus congestion.  Get adequate rest.   May use Afrin nasal spray (or generic oxymetazoline) each morning for about 5 days and then discontinue.  Also recommend using saline nasal spray several times daily and saline nasal irrigation (AYR is a common brand).  Use Flonase nasal spray each morning after using Afrin nasal spray and saline nasal irrigation. Try warm salt water gargles for sore throat.  Stop all antihistamines for now, and other non-prescription cough/cold preparations. May take Delsym Cough Suppressant at bedtime for nighttime cough.  

## 2017-03-19 NOTE — ED Triage Notes (Signed)
Chest Congestion, Cough x 1 week

## 2017-03-20 NOTE — ED Provider Notes (Signed)
[facility]    CSN: [id number] Arrival date & time: 03/19/17  1056     History   Chief Complaint Chief Complaint  Patient presents with  . Cough    HPI Albert Knight is a 41 y.o. male.   Six days ago patient developed typical cold-like symptoms developing over several days, including mild sore throat, sinus congestion, headache, fatigue, chills/sweats, and cough.  His ears have felt full.  He denies pleuritic pain or shortness of breath. Three days ago he had a negative flu test and received a Solumedrol injection.  His symptoms have not improved taking Mucinex, Daquil, and Nyquil. He has a history of seasonal rhinitis (spring and fall) and had pneumonia in his teens.   The history is provided by the patient.    Past Medical History:  Diagnosis Date  . Hypertension     Patient Active Problem List   Diagnosis Date Noted  . Lymphadenopathy 04/05/2013    Past Surgical History:  Procedure Laterality Date  . SHOULDER SURGERY         Home Medications    Prior to Admission medications   Medication Sig Start Date End Date Taking? Authorizing Provider  azithromycin (ZITHROMAX Z-PAK) 250 MG tablet Take 2 tabs today; then begin one tab once daily for 4 more days. 03/19/17   Lattie HawBeese, Stephen A, MD  fluticasone (FLONASE) 50 MCG/ACT nasal spray Place 2 sprays into both nostrils daily. LAST REFILL. YOU MUST ESTABLISH WITH A PCP FOR REFILLS 12/23/16   Monica Bectonhekkekandam, Thomas J, MD  fluticasone [facility](FLONASE) 50 MCG/ACT nasal spray Place 2 sprays into both nostrils daily. Follow up appointment needed for future refills. 12/23/16   Monica Bectonhekkekandam, Thomas J, MD    Family History Family History  Problem Relation Age of Onset  . Hypertension Mother   . Hyperlipidemia Mother   . Hypertension Father   . Hyperlipidemia Father     Social History Social History   Tobacco Use  . Smoking status: Never Smoker  . Smokeless tobacco: Never Used  Substance Use Topics  . Alcohol use:  Yes    Frequency: Never    Comment: 1-2 per month   . Drug use: No     Allergies   Patient has no known allergies.   Review of Systems Review of Systems + sore throat + cough No pleuritic pain ? wheezing + nasal congestion + post-nasal drainage No sinus pain/pressure No itchy/red eyes No earache No hemoptysis No SOB No fever, + chills/sweats No nausea No vomiting No abdominal pain No diarrhea No urinary symptoms No skin rash + fatigue No myalgias + headache Used OTC meds without relief   Physical Exam Triage Vital Signs ED Triage Vitals  Enc Vitals Group     BP 03/19/17 1123 123/79     Pulse Rate 03/19/17 1123 78     Resp 03/19/17 1123 16     Temp 03/19/17 1123 (!) 97.3 F (36.3 C)     Temp Source 03/19/17 1123 Oral     SpO2 03/19/17 1123 98 %     Weight 03/19/17 1126 185 lb (83.9 kg)     Height 03/19/17 1126 5\' 8"  (1.727 m)     Head Circumference --      Peak Flow --      Pain Score 03/19/17 1126 0     Pain Loc --      Pain Edu? --      Excl. in GC? --    No data found.  Updated Vital Signs BP 123/79 (BP Location: Left Arm)   Pulse 78   Temp (!) 97.3 F (36.3 C) (Oral)   Resp 16   Ht 5\' 8"  (1.727 m)   Wt 185 lb (83.9 kg)   SpO2 98%   BMI 28.13 kg/m   Visual Acuity Right Eye Distance:   Left Eye Distance:   Bilateral Distance:    Right Eye Near:   Left Eye Near:    Bilateral Near:     Physical Exam Nursing notes and Vital Signs reviewed. Appearance:  Patient appears stated age, and in no acute distress Eyes:  Pupils are equal, round, and reactive to light and accomodation.  Extraocular movement is intact.  Conjunctivae are not inflamed  Ears:  Canals normal.  Tympanic membranes normal.  Nose:  Mildly congested turbinates.  No sinus tenderness. Pharynx:  Normal Neck:  Supple.  Enlarged posterior/lateral nodes are palpated bilaterally, tender to palpation on the left.   Lungs:  Course breath sounds.  Breath sounds are equal.  Moving  air well. Heart:  Regular rate and rhythm without murmurs, rubs, or gallops.  Abdomen:  Nontender without masses or hepatosplenomegaly.  Bowel sounds are present.  No CVA or flank tenderness.  Extremities:  No edema.  Skin:  No rash present.    UC Treatments / Results  Labs (all labs ordered are listed, but only abnormal results are displayed) Labs Reviewed  POCT CBC W AUTO DIFF ([facility]):  WBC 5.6; LY 27.0; MO 7.4; GR 65.6; Hgb 14.4; Platelets 279     EKG  EKG Interpretation None       Radiology Dg Chest 2 View  Result Date: 03/19/2017 CLINICAL DATA:  41 year old male with productive cough, congestion and chest tightness for 1 week. EXAM: CHEST  2 VIEW COMPARISON:  None. FINDINGS: Cardiomediastinal silhouette is normal in size and morphology. Focal opacity at the left perihilar region consistent with left upper lobe infiltrate. The right lung is clear. No pleural effusions or pneumothorax. Postoperative changes of the left clavicle noted. No acute osseous abnormalities. IMPRESSION: Left upper lobe pneumonia.  Follow-up to resolution recommended. Electronically Signed   By: Sande Brothers M.D.   On: 03/19/2017 11:34    Procedures Procedures (including critical care time)  Medications Ordered in UC Medications - No data to display   Initial Impression / Assessment and Plan / UC Course  I have reviewed the triage vital signs and the nursing notes.  Pertinent labs & imaging results that were available during my care of the patient were reviewed by me and considered in my medical decision making (see chart for details).    Begin Z-pak for atypical coverage. Take plain guaifenesin (1200mg  extended release tabs such as Mucinex) twice daily, with plenty of water, for cough and congestion.  May add Pseudoephedrine (30mg , one or two every 4 to 6 hours) for sinus congestion.  Get adequate rest.   May use Afrin nasal spray (or generic oxymetazoline) each morning for about 5  days and then discontinue.  Also recommend using saline nasal spray several times daily and saline nasal irrigation (AYR is a common brand).  Use Flonase nasal spray each morning after using Afrin nasal spray and saline nasal irrigation. Try warm salt water gargles for sore throat.  Stop all antihistamines for now, and other non-prescription cough/cold preparations. May take Delsym Cough Suppressant at bedtime for nighttime cough.  Return in 10 days to repeat chest X-ray (patient does not have a PCP).  Final Clinical Impressions(s) / UC Diagnoses   Final diagnoses:  Pneumonia of left upper lobe due to infectious organism [facility](HCC)    ED Discharge Orders        Ordered    azithromycin (ZITHROMAX Z-PAK) 250 MG tablet     03/19/17 1332           Lattie HawBeese, Stephen A, MD 03/20/17 [id number]

## 2017-03-21 ENCOUNTER — Telehealth: Payer: Self-pay

## 2017-03-21 NOTE — Telephone Encounter (Signed)
Spoke with patient.  Feeling better.  Will follow up as needed. 

## 2018-03-01 MED FILL — FLUOCINONIDE 0.05% CREAM: 0.05 | 7 days supply | Qty: 30 | Fill #0

## 2018-05-02 ENCOUNTER — Encounter: Payer: Self-pay | Admitting: *Deleted

## 2018-05-02 ENCOUNTER — Emergency Department (INDEPENDENT_AMBULATORY_CARE_PROVIDER_SITE_OTHER)
Admission: EM | Admit: 2018-05-02 | Discharge: 2018-05-02 | Disposition: A | Discharge status: Home / Self Care | Payer: 59 | Source: Home / Self Care | Attending: Family Medicine | Admitting: Family Medicine

## 2018-05-02 ENCOUNTER — Other Ambulatory Visit: Payer: Self-pay

## 2018-05-02 DIAGNOSIS — H6592 Unspecified nonsuppurative otitis media, left ear: Secondary | ICD-10-CM | POA: Diagnosis not present

## 2018-05-02 MED ORDER — METHYLPREDNISOLONE ACETATE 80 MG/ML IJ SUSP
80.0000 mg | Freq: Once | INTRAMUSCULAR | Status: AC
  Administered 2018-05-02: 80 mg via INTRAMUSCULAR

## 2018-05-02 MED ORDER — AMOXICILLIN-POT CLAVULANATE 875-125 MG PO TABS: ORAL_TABLET | ORAL | 0 refills | Status: DC

## 2018-05-02 MED FILL — AMOX-CLAV 875-125 MG TABLET: 875-125 | 7 days supply | Qty: 14 | Fill #0

## 2018-05-02 NOTE — ED Provider Notes (Signed)
[facility]    CSN: [id number] Arrival date & time: 05/02/18  1600     History   Chief Complaint Chief Complaint  Patient presents with  . Otalgia    HPI Albert Knight is a 43 y.o. male.   Patient developed pain in his left ear 3 days ago, followed by a scratchy throat.  During the past 3 days he has developed mild sinus congestion and cough, but feels well otherwise.  His sore throat became worse today, as well as increased left ear pain with a small amount of bloody drainage from the left ear.  He feels mildly fatigued.  He has a past history of frequent ear infections.  He has perennial rhinitis for which he takes Zyrtec and Flonase.  The history is provided by the patient.    Past Medical History:  Diagnosis Date  . Hypertension     Patient Active Problem List   Diagnosis Date Noted  . Lymphadenopathy 04/05/2013    Past Surgical History:  Procedure Laterality Date  . SHOULDER SURGERY         Home Medications    Prior to Admission medications   Medication Sig Start Date End Date Taking? Authorizing Provider  amoxicillin-clavulanate (AUGMENTIN) 875-125 MG tablet Take one tab by mouth every 12 hours. Take with food 05/02/18   Lattie Haw, MD  fluticasone [facility]) 50 MCG/ACT nasal spray Place 2 sprays into both nostrils daily. Follow up appointment needed for future refills. 12/23/16   Monica Becton, MD    Family History Family History  Problem Relation Age of Onset  . Hypertension Mother   . Hyperlipidemia Mother   . Hypertension Father   . Hyperlipidemia Father     Social History Social History   Tobacco Use  . Smoking status: Never Smoker  . Smokeless tobacco: Never Used  Substance Use Topics  . Alcohol use: Yes    Frequency: Never    Comment: 1-2 per month   . Drug use: No     Allergies   Patient has no known allergies.   Review of Systems Review of Systems + sore throat + occasional cough No pleuritic pain No  wheezing + nasal congestion + post-nasal drainage No sinus pain/pressure No itchy/red eyes + left earache No hemoptysis No SOB ? fever, + chills No nausea No vomiting No abdominal pain No diarrhea No urinary symptoms No skin rash + mild fatigue No myalgias No headache Used OTC meds without relief   Physical Exam Triage Vital Signs ED Triage Vitals  Enc Vitals Group     BP 05/02/18 1617 (!) 167/99     Pulse Rate 05/02/18 1617 64     Resp 05/02/18 1617 18     Temp 05/02/18 1617 98.1 F (36.7 C)     Temp Source 05/02/18 1617 Oral     SpO2 05/02/18 1617 97 %     Weight 05/02/18 1625 176 lb (79.8 kg)     Height 05/02/18 1625 5' 8.5" (1.74 m)     Head Circumference --      Peak Flow --      Pain Score 05/02/18 1625 4     Pain Loc --      Pain Edu? --      Excl. in GC? --    No data found.  Updated Vital Signs BP (!) 167/99 (BP Location: Right Arm)   Pulse 64   Temp 98.1 F (36.7 C) (Oral)   Resp 18  Ht 5' 8.5" (1.74 m)   Wt 79.8 kg   SpO2 97%   BMI 26.37 kg/m   Visual Acuity Right Eye Distance:   Left Eye Distance:   Bilateral Distance:    Right Eye Near:   Left Eye Near:    Bilateral Near:     Physical Exam Nursing notes and Vital Signs reviewed. Appearance:  Patient appears stated age, and in no acute distress Eyes:  Pupils are equal, round, and reactive to light and accomodation.  Extraocular movement is intact.  Conjunctivae are not inflamed  Ears:  Canals normal.  Right tympanic membrane scarred, but otherwise normal.  Left tympanic membrane is erythematous and bulging. Nose:  Mildly congested turbinates.  No sinus tenderness. Pharynx:  Normal   Neck:  Supple.  Enlarged nontender posterior/lateral nodes are palpated bilaterally,  Lungs:  Clear to auscultation.  Breath sounds are equal.  Moving air well. Heart:  Regular rate and rhythm without murmurs, rubs, or gallops.  Abdomen:  Nontender without masses or hepatosplenomegaly.  Bowel sounds are  present.  No CVA or flank tenderness.  Extremities:  No edema.  Skin:  No rash present.    UC Treatments / Results  Labs (all labs ordered are listed, but only abnormal results are displayed) Labs Reviewed -   Tympanometry:  Right ear tympanogram normal; Left ear tympanogram low peak height ("flat line"") and normal ear volume.  EKG None  Radiology No results found.  Procedures Procedures (including critical care time)  Medications Ordered in UC Medications  methylPREDNISolone acetate (DEPO-MEDROL) injection 80 mg (80 mg Intramuscular Given 05/02/18 1652)    Initial Impression / Assessment and Plan / UC Course  I have reviewed the triage vital signs and the nursing notes.  Pertinent labs & imaging results that were available during my care of the patient were reviewed by me and considered in my medical decision making (see chart for details).    Suspect developing viral URI. Administered Depo Medrol 80mg  IM.  Begin Augmentin. Followup with ENT if not resolving within one week.   Final Clinical Impressions(s) / UC Diagnoses   Final diagnoses:  Left otitis media with effusion     Discharge Instructions     Try Pseudoephedrine (30mg , one or two every 4 to 6 hours) for sinus congestion. May use Afrin nasal spray (or generic oxymetazoline) each morning for about 5 days and then discontinue.  Also recommend using saline nasal spray several times daily and saline nasal irrigation (AYR is a common brand).  Use Flonase nasal spray each morning after using Afrin nasal spray and saline nasal irrigation. Stop all antihistamines for now, and other non-prescription cough/cold preparations.  If increasing cold symptoms develop, try the following: Take plain guaifenesin (1200mg  extended release tabs such as Mucinex) twice daily, with plenty of water, for cough and congestion.   Get adequate rest.   May take Delsym Cough Suppressant at bedtime for nighttime cough.  Try warm salt water  gargles for sore throat.          ED Prescriptions    Medication Sig Dispense Auth. Provider   amoxicillin-clavulanate (AUGMENTIN) 875-125 MG tablet Take one tab by mouth every 12 hours. Take with food 14 tablet Lattie Haw, MD         Lattie Haw, MD 05/02/18 1700

## 2018-05-02 NOTE — ED Triage Notes (Signed)
Pt c/o LT ear pain and blood in ear x 2 days, with post nasal drip x 1 day.

## 2018-05-02 NOTE — Discharge Instructions (Addendum)
Try Pseudoephedrine (30mg , one or two every 4 to 6 hours) for sinus congestion. May use Afrin nasal spray (or generic oxymetazoline) each morning for about 5 days and then discontinue.  Also recommend using saline nasal spray several times daily and saline nasal irrigation (AYR is a common brand).  Use Flonase nasal spray each morning after using Afrin nasal spray and saline nasal irrigation. Stop all antihistamines for now, and other non-prescription cough/cold preparations.  If increasing cold symptoms develop, try the following: Take plain guaifenesin (1200mg  extended release tabs such as Mucinex) twice daily, with plenty of water, for cough and congestion.   Get adequate rest.   May take Delsym Cough Suppressant at bedtime for nighttime cough.  Try warm salt water gargles for sore throat.

## 2018-05-16 ENCOUNTER — Encounter: Payer: Self-pay | Admitting: Physician Assistant

## 2018-05-16 ENCOUNTER — Ambulatory Visit (INDEPENDENT_AMBULATORY_CARE_PROVIDER_SITE_OTHER): Payer: 59 | Admitting: Physician Assistant

## 2018-05-16 VITALS — BP 129/79 | HR 68 | Ht 68.5 in | Wt 184.0 lb

## 2018-05-16 DIAGNOSIS — Z9109 Other allergy status, other than to drugs and biological substances: Secondary | ICD-10-CM | POA: Diagnosis not present

## 2018-05-16 DIAGNOSIS — Z0000 Encounter for general adult medical examination without abnormal findings: Secondary | ICD-10-CM

## 2018-05-16 DIAGNOSIS — R030 Elevated blood-pressure reading, without diagnosis of hypertension: Secondary | ICD-10-CM | POA: Diagnosis not present

## 2018-05-16 DIAGNOSIS — R5382 Chronic fatigue, unspecified: Secondary | ICD-10-CM

## 2018-05-16 DIAGNOSIS — Z7689 Persons encountering health services in other specified circumstances: Secondary | ICD-10-CM

## 2018-05-16 DIAGNOSIS — Z1321 Encounter for screening for nutritional disorder: Secondary | ICD-10-CM

## 2018-05-16 NOTE — Patient Instructions (Signed)
For your blood pressure: - Goal <130/80 (Ideally 120's/70's) - monitor and log blood pressures at home - check around the same time each day in a relaxed setting - Limit salt to <2500 mg/day - Follow DASH (Dietary Approach to Stopping Hypertension) eating plan - Try to get at least 150 minutes of aerobic exercise per week - Aim to go on a brisk walk 30 minutes per day at least 5 days per week. If you're not active, gradually increase how long you walk by 5 minutes each week - limit alcohol: 2 standard drinks per day for men and 1 per day for women - avoid tobacco/nicotine products. Consider smoking cessation if you smoke - weight loss: 7% of current body weight can reduce your blood pressure by 5-10 points - follow-up at least every 6 months for your blood pressure. Follow-up sooner if your BP is not controlled   Preventive Care 40-64 Years, Male Preventive care refers to lifestyle choices and visits with your health care provider that can promote health and wellness. What does preventive care include?   A yearly physical exam. This is also called an annual well check.  Dental exams once or twice a year.  Routine eye exams. Ask your health care provider how often you should have your eyes checked.  Personal lifestyle choices, including: ? Daily care of your teeth and gums. ? Regular physical activity. ? Eating a healthy diet. ? Avoiding tobacco and drug use. ? Limiting alcohol use. ? Practicing safe sex. ? Taking low-dose aspirin every day starting at age 50. What happens during an annual well check? The services and screenings done by your health care provider during your annual well check will depend on your age, overall health, lifestyle risk factors, and family history of disease. Counseling Your health care provider may ask you questions about your:  Alcohol use.  Tobacco use.  Drug use.  Emotional well-being.  Home and relationship well-being.  Sexual  activity.  Eating habits.  Work and work environment. Screening You may have the following tests or measurements:  Height, weight, and BMI.  Blood pressure.  Lipid and cholesterol levels. These may be checked every 5 years, or more frequently if you are over 50 years old.  Skin check.  Lung cancer screening. You may have this screening every year starting at age 55 if you have a 30-pack-year history of smoking and currently smoke or have quit within the past 15 years.  Colorectal cancer screening. All adults should have this screening starting at age 50 and continuing until age 75. Your health care provider may recommend screening at age 45. You will have tests every 1-10 years, depending on your results and the type of screening test. People at increased risk should start screening at an earlier age. Screening tests may include: ? Guaiac-based fecal occult blood testing. ? Fecal immunochemical test (FIT). ? Stool DNA test. ? Virtual colonoscopy. ? Sigmoidoscopy. During this test, a flexible tube with a tiny camera (sigmoidoscope) is used to examine your rectum and lower colon. The sigmoidoscope is inserted through your anus into your rectum and lower colon. ? Colonoscopy. During this test, a long, thin, flexible tube with a tiny camera (colonoscope) is used to examine your entire colon and rectum.  Prostate cancer screening. Recommendations will vary depending on your family history and other risks.  Hepatitis C blood test.  Hepatitis B blood test.  Sexually transmitted disease (STD) testing.  Diabetes screening. This is done by checking your blood   sugar (glucose) after you have not eaten for a while (fasting). You may have this done every 1-3 years. Discuss your test results, treatment options, and if necessary, the need for more tests with your health care provider. Vaccines Your health care provider may recommend certain vaccines, such as:  Influenza vaccine. This is  recommended every year.  Tetanus, diphtheria, and acellular pertussis (Tdap, Td) vaccine. You may need a Td booster every 10 years.  Varicella vaccine. You may need this if you have not been vaccinated.  Zoster vaccine. You may need this after age 60.  Measles, mumps, and rubella (MMR) vaccine. You may need at least one dose of MMR if you were born in 1957 or later. You may also need a second dose.  Pneumococcal 13-valent conjugate (PCV13) vaccine. You may need this if you have certain conditions and have not been vaccinated.  Pneumococcal polysaccharide (PPSV23) vaccine. You may need one or two doses if you smoke cigarettes or if you have certain conditions.  Meningococcal vaccine. You may need this if you have certain conditions.  Hepatitis A vaccine. You may need this if you have certain conditions or if you travel or work in places where you may be exposed to hepatitis A.  Hepatitis B vaccine. You may need this if you have certain conditions or if you travel or work in places where you may be exposed to hepatitis B.  Haemophilus influenzae type b (Hib) vaccine. You may need this if you have certain risk factors. Talk to your health care provider about which screenings and vaccines you need and how often you need them. This information is not intended to replace advice given to you by your health care provider. Make sure you discuss any questions you have with your health care provider. Document Released: 05/09/2015 Document Revised: 06/02/2017 Document Reviewed: 02/11/2015 [employer]vier Interactive Patient Education  2019 [employer]  

## 2018-05-16 NOTE — Progress Notes (Signed)
HPI:                                                                Albert Knight is a 43 y.o. male who presents to [facility] Albert Knight: Primary Care Sports Medicine today to establish care  Current concerns include   Reports feeling fatigued in the winter time, and he drinks a lot of caffeine to compensate. Reports sleeping 7 hours nightly, sleep is restorative. Endorses +lack of energy + decreased strength/endurance +lost height +irritable +falling asleep after dinner He is requesting a B12 injection for fatigue. He has no hx of B12 deficiency or pernicious anemia.   Health Maintenance Health Maintenance  Topic Date Due  . HIV Screening  12/26/1989  . TETANUS/TDAP  06/17/2025  . INFLUENZA VACCINE  Completed   Depression screen Albert Knight 2/9 05/16/2018  Decreased Interest 0  Down, Depressed, Hopeless 0  PHQ - 2 Score 0     Health Habits  Diet: keto and carb cycling  Exercise: 5-6 days per week Reports he has lost 30 pounds in the last year  Past Medical History:  Diagnosis Date  . Allergy   . Hypertension   . Pneumonia    Past Surgical History:  Procedure Laterality Date  . CLAVICLE SURGERY    . NASAL FRACTURE SURGERY    . NOSE SURGERY    . SHOULDER SURGERY     Social History   Tobacco Use  . Smoking status: Never Smoker  . Smokeless tobacco: Never Used  Substance Use Topics  . Alcohol use: Not Currently    Frequency: Never    Comment: 1-2 per month    family history includes Breast cancer in his mother; Heart attack in his maternal grandfather, paternal grandfather, and paternal grandmother; Hyperlipidemia in his father and mother; Hypertension in his father and mother.  ROS: Review of Systems  Constitutional: Positive for malaise/fatigue.  HENT: Positive for sinus pain.   Musculoskeletal: Positive for back pain, joint pain and neck pain.  All other systems reviewed and are negative.    Medications: No current outpatient medications on  file.   No current facility-administered medications for this visit.    No Known Allergies     Objective:  BP (!) 143/83   Pulse 68   Ht 5' 8.5" (1.74 m)   Wt 184 lb (83.5 kg)   BMI 27.57 kg/m  Vitals:   05/16/18 0910  BP: (!) 143/83  Pulse: 68  General Appearance:  Alert, cooperative, no distress, appropriate for age                            Head:  Normocephalic, without obvious abnormality                             Eyes:  PERRL, EOM's intact, conjunctiva and cornea clear                             Ears:  TM pearly gray color and semitransparent, external ear canals normal, both ears  Nose:  Nares symmetrical, mucosa pink                          Throat:  Lips, tongue, and mucosa are moist, pink, and intact; oropharynx clear, uvula midline; good dentition                             Neck:  Supple; symmetrical, trachea midline, no adenopathy; thyroid: no enlargement, symmetric, no tenderness/mass/nodules                             Back:  Symmetrical, no curvature, ROM normal                                     Lungs:  Clear to auscultation bilaterally, respirations unlabored                             Heart:  normal rate & regular rhythm, S1 and S2 normal, no murmurs, rubs, or gallops                     Abdomen:  Soft, non-tender, no mass or organomegaly              Genitourinary:  deferred         Musculoskeletal:  Tone and strength strong and symmetrical, all extremities; no joint pain or edema, normal gait and station                        Lymphatic:  No adenopathy             Skin/Hair/Nails:  Skin warm, dry and intact, no rashes or abnormal dyspigmentation on limited exam                   Neurologic:  Alert and oriented x3, no cranial nerve deficits,  sensation grossly intact, normal gait and station, no tremor Psych: well-groomed, cooperative, good eye contact, euthymic mood, affect mood-congruent, speech is articulate, and thought  processes clear and goal-directed   BP Readings from Last 3 Encounters:  05/16/18 (!) 143/83  05/02/18 (!) 167/99  03/19/17 123/79    No results found for this or any previous visit (from the past 72 hour(s)). No results found.    Assessment and Plan: 43 y.o. male with   .Albert Knight was seen today for establish care.  Diagnoses and all orders for this visit:  Encounter to establish care  Environmental allergies  White coat syndrome without diagnosis of hypertension  Encounter for annual physical exam -     B12 and Folate Panel -     CBC -     VITAMIN D 25 Hydroxy (Vit-D Deficiency, Fractures) -     COMPLETE METABOLIC PANEL WITH GFR -     Lipid Panel w/reflex Direct LDL  Chronic fatigue -     B12 and Folate Panel -     VITAMIN D 25 Hydroxy (Vit-D Deficiency, Fractures) -     Testosterone  Encounter for vitamin deficiency screening -     B12 and Folate Panel -     VITAMIN D 25 Hydroxy (Vit-D Deficiency, Fractures)   - Personally reviewed PMH, PSH, PFH, medications, allergies, HM -  Age-appropriate cancer screening: colon cancer screening beginning age 44, baseline PSA age 43 - Influenza UTD - Tdap UTD - PHQ2 negative - Fasting labs pending  Fatigue - Mildly positie ADAM Questionnaire for androgen deficiency. Will check morning fasting testosterone level. Based on history, there may be a component of seasonal affective disorder. Will check B12 level before providing any B12 supplementation.  Elevated BP - improved on recheck. Patient denies hx of HTN and states he has white coat syndrome. Family hx of HTN in both parents. Patient to monitor and log BP's at home. Counseled on therapeutic lifestyle changes.  Patient education and anticipatory guidance given Patient agrees with treatment plan Follow-up in 1 year for CPE with fasting labs or sooner as needed  Levonne Hubertharley E.  PA-C

## 2018-05-17 ENCOUNTER — Encounter: Payer: Self-pay | Admitting: Physician Assistant

## 2018-05-17 DIAGNOSIS — E782 Mixed hyperlipidemia: Secondary | ICD-10-CM

## 2018-05-17 HISTORY — DX: Mixed hyperlipidemia: E78.2

## 2018-05-17 LAB — B12 AND FOLATE PANEL
Folate: 12.6 ng/mL
VITAMIN B 12: 618 pg/mL (ref 200–1100)

## 2018-05-17 LAB — COMPLETE METABOLIC PANEL WITH GFR
AG RATIO: 2.2 (calc) (ref 1.0–2.5)
ALT: 28 U/L (ref 9–46)
AST: 37 U/L (ref 10–40)
Albumin: 4.7 g/dL (ref 3.6–5.1)
Alkaline phosphatase (APISO): 51 U/L (ref 40–115)
BILIRUBIN TOTAL: 0.9 mg/dL (ref 0.2–1.2)
BUN: 22 mg/dL (ref 7–25)
CHLORIDE: 101 mmol/L (ref 98–110)
CO2: 31 mmol/L (ref 20–32)
Calcium: 9.6 mg/dL (ref 8.6–10.3)
Creat: 1.03 mg/dL (ref 0.60–1.35)
GFR, EST AFRICAN AMERICAN: 103 mL/min/{1.73_m2} (ref >=60)
GFR, Est Non African American: 89 mL/min/{1.73_m2} (ref >=60)
GLOBULIN: 2.1 g/dL (ref 1.9–3.7)
Glucose, Bld: 85 mg/dL (ref 65–99)
Potassium: 4.5 mmol/L (ref 3.5–5.3)
SODIUM: 139 mmol/L (ref 135–146)
TOTAL PROTEIN: 6.8 g/dL (ref 6.1–8.1)

## 2018-05-17 LAB — CBC
HCT: 46.2 % (ref 38.5–50.0)
HEMOGLOBIN: 15.7 g/dL (ref 13.2–17.1)
MCH: 31.4 pg (ref 27.0–33.0)
MCHC: 34 g/dL (ref 32.0–36.0)
MCV: 92.4 fL (ref 80.0–100.0)
MPV: 9.5 fL (ref 7.5–12.5)
Platelets: 317 10*3/uL (ref 140–400)
RBC: 5 10*6/uL (ref 4.20–5.80)
RDW: 12.7 % (ref 11.0–15.0)
WBC: 6.4 10*3/uL (ref 3.8–10.8)

## 2018-05-17 LAB — LIPID PANEL W/REFLEX DIRECT LDL
Cholesterol: 256 mg/dL — ABNORMAL HIGH (ref <200)
HDL: 74 mg/dL (ref >40)
LDL CHOLESTEROL (CALC): 167 mg/dL — AB
Non-HDL Cholesterol (Calc): 182 mg/dL (calc) — ABNORMAL HIGH (ref <130)
Total CHOL/HDL Ratio: 3.5 (calc) (ref <5.0)
Triglycerides: 60 mg/dL (ref <150)

## 2018-05-17 LAB — VITAMIN D 25 HYDROXY (VIT D DEFICIENCY, FRACTURES): Vit D, 25-Hydroxy: 36 ng/mL (ref 30–100)

## 2018-05-17 LAB — TESTOSTERONE: TESTOSTERONE: 607 ng/dL (ref 250–827)

## 2018-05-31 ENCOUNTER — Other Ambulatory Visit: Payer: Self-pay | Admitting: Physician Assistant

## 2018-06-01 LAB — TESTOSTERONE: TESTOSTERONE: 571 ng/dL (ref 250–827)

## 2018-08-22 ENCOUNTER — Telehealth: Payer: Self-pay

## 2018-08-22 DIAGNOSIS — E782 Mixed hyperlipidemia: Secondary | ICD-10-CM

## 2018-08-22 MED ORDER — ATORVASTATIN CALCIUM 20 MG PO TABS: 20.0000 mg | ORAL_TABLET | Freq: Every day | ORAL | 2 refills | Status: DC

## 2018-08-22 MED FILL — ATORVASTATIN 20 MG TABLET: 20 | 30 days supply | Qty: 30 | Fill #0

## 2018-08-22 NOTE — Telephone Encounter (Signed)
Pt advised.

## 2018-08-22 NOTE — Telephone Encounter (Signed)
Pt left vm stating that he is ready to start statin that was recommended in January please advise. -EH/RMA

## 2018-08-22 NOTE — Telephone Encounter (Signed)
Start Atorvastatin 20 mg daily Sent to [facility]

## 2018-09-12 ENCOUNTER — Other Ambulatory Visit: Payer: Self-pay

## 2018-09-12 DIAGNOSIS — E782 Mixed hyperlipidemia: Secondary | ICD-10-CM

## 2018-09-12 MED ORDER — ATORVASTATIN CALCIUM 20 MG PO TABS: 20.0000 mg | ORAL_TABLET | Freq: Every day | ORAL | 1 refills | Status: DC

## 2018-12-15 ENCOUNTER — Telehealth: Payer: Self-pay | Admitting: Physician Assistant

## 2018-12-15 DIAGNOSIS — Z3009 Encounter for other general counseling and advice on contraception: Secondary | ICD-10-CM

## 2018-12-15 NOTE — Telephone Encounter (Signed)
Pt called and is asking for referral to urology with Dr. Risa Grill  For vasectomy

## 2018-12-18 NOTE — Telephone Encounter (Signed)
Referral placed to [facility], Dr. Risa Grill

## 2018-12-21 NOTE — Telephone Encounter (Signed)
Referral sent 

## 2019-01-08 ENCOUNTER — Other Ambulatory Visit: Payer: Self-pay | Admitting: Physician Assistant

## 2019-01-08 DIAGNOSIS — E782 Mixed hyperlipidemia: Secondary | ICD-10-CM

## 2019-05-28 ENCOUNTER — Encounter: Payer: Self-pay | Admitting: Medical-Surgical

## 2019-05-28 ENCOUNTER — Ambulatory Visit (INDEPENDENT_AMBULATORY_CARE_PROVIDER_SITE_OTHER): Payer: 59 | Admitting: Medical-Surgical

## 2019-05-28 VITALS — Temp 97.9°F

## 2019-05-28 DIAGNOSIS — G479 Sleep disorder, unspecified: Secondary | ICD-10-CM

## 2019-05-28 DIAGNOSIS — Z0000 Encounter for general adult medical examination without abnormal findings: Secondary | ICD-10-CM

## 2019-05-28 MED ORDER — SUVOREXANT 5 MG PO TABS: 5.0000 mg | ORAL_TABLET | Freq: Every evening | ORAL | 0 refills | Status: DC | PRN

## 2019-05-28 NOTE — Progress Notes (Addendum)
Virtual Visit via Video Note  I connected with Albert Knight on 05/28/19 at 11:10 AM EST by a video enabled telemedicine application and verified that I am speaking with the correct person using two identifiers.   I discussed the limitations of evaluation and management by telemedicine and the availability of in person appointments. The patient expressed understanding and agreed to proceed.  Subjective:    CC: Sleeping medication  HPI: Pleasant 44 year old male presenting today with difficulty sleeping.  No problem falling asleep but wakes at night and has difficulty getting back to sleep.   Symptoms seem to have been mostly on Sunday, Monday, and Tuesday nights.  Uses melatonin 5 mg nightly, minimal relief.  Tried a dose of Lunesta from a friend, reports good quality sleep. Significant life changes in the past year, separated with divorce in process.  Denies depression/anxiety.  Coping well, less stressed with less conflict in the home.  Past medical history, Surgical history, Family history not pertinant except as noted below, Social history, Allergies, and medications have been entered into the medical record, reviewed, and corrections made.   Review of Systems: No fevers, chills, night sweats, weight loss, chest pain, or shortness of breath.   Depression screen [facility] 2/9 05/28/2019 05/16/2018  Decreased Interest 0 0  Down, Depressed, Hopeless 0 0  PHQ - 2 Score 0 0  Altered sleeping 2 -  Tired, decreased energy 1 -  Change in appetite 0 -  Feeling bad or failure about yourself  0 -  Trouble concentrating 0 -  Moving slowly or fidgety/restless 0 -  Suicidal thoughts 0 -  PHQ-9 Score 3 -  Difficult doing work/chores Not difficult at all -   GAD 7 : Generalized Anxiety Score 05/28/2019  Nervous, Anxious, on Edge 0  Control/stop worrying 0  Worry too much - different things 0  Trouble relaxing 0  Restless 1  Easily annoyed or irritable 0  Afraid - awful might happen 0  Total GAD 7  Score 1  Anxiety Difficulty Not difficult at all     Objective:    General: Speaking clearly in complete sentences without any shortness of breath.  Alert and oriented x3.  Normal judgment. No apparent acute distress.   Impression and Recommendations:    Sleeping difficulty Discussed options to start Lexapro or Trazadone, pt declined and does not want any medications used to treat anxiety or depression. Will trial Belsomra 5 mg p.o. nightly as needed.  If ineffective may increase to 10 mg p.o. nightly.  Importance of good sleep hygiene and nightly routine reviewed.  Preventative care Due for annual physical exam and labs.  We will plan to do this in 2 to 3 weeks.  Orders entered to allow for drawing labs ahead of scheduled appointment.  I discussed the assessment and treatment plan with the patient. The patient was provided an opportunity to ask questions and all were answered. The patient agreed with the plan and demonstrated an understanding of the instructions.   The patient was advised to call back or seek an in-person evaluation if the symptoms worsen or if the condition fails to improve as anticipated.  Return in about 3 weeks (around 06/18/2019) for annual physical exam and labs.  35 minutes of non-face-to-face time was provided during this encounter.  Thayer Ohm, DNP, APRN, FNP-BC [facility]

## 2019-05-31 ENCOUNTER — Telehealth: Payer: Self-pay | Admitting: Medical-Surgical

## 2019-05-31 NOTE — Telephone Encounter (Signed)
Received fax from [employer] that Belsomra requires a PA. Information has been sent to the insurance company. Awaiting determination.   

## 2019-06-01 NOTE — Telephone Encounter (Signed)
Received a fax that insurance will only allow 1 tablet of Belsomra a day. Pharmacy aware. No other questions.

## 2019-06-18 ENCOUNTER — Telehealth: Payer: Self-pay | Admitting: Medical-Surgical

## 2019-06-18 NOTE — Telephone Encounter (Signed)
Received fax for PA on Belsomra 5mg  sent through cover my meds waiting on determination. - CF

## 2019-06-19 ENCOUNTER — Other Ambulatory Visit: Payer: Self-pay | Admitting: Physician Assistant

## 2019-06-19 DIAGNOSIS — E782 Mixed hyperlipidemia: Secondary | ICD-10-CM

## 2019-06-19 MED ORDER — SUVOREXANT 10 MG PO TABS: 10.0000 | ORAL_TABLET | Freq: Every evening | ORAL | 2 refills | Status: DC | PRN

## 2019-06-19 NOTE — Telephone Encounter (Signed)
Received fax from [employer] and they denied coverage on Belsomra due to they will only cover more than one tablet per day if certain criteria is met I am placing in providers box for review. - CF

## 2019-06-19 NOTE — Addendum Note (Signed)
Addended byChristen Butter on: 06/19/2019 01:58 PM   Modules accepted: Orders

## 2019-06-20 ENCOUNTER — Other Ambulatory Visit: Payer: Self-pay

## 2019-06-20 DIAGNOSIS — G479 Sleep disorder, unspecified: Secondary | ICD-10-CM

## 2019-06-20 MED ORDER — SUVOREXANT 10 MG PO TABS: 1.0000 | ORAL_TABLET | Freq: Every evening | ORAL | 2 refills | Status: DC | PRN

## 2019-06-20 NOTE — Telephone Encounter (Signed)
[facility] pharmacy fax requesting clarification for suvorexant 10 mg. Was originally written as "10 tablets by mouth at bedtime as needed". I reviewed the OV note and saw that it was supposed to be 10 mg qhs prn and tried to resend it to them, but I guess it is a controlled and you have to authorize. I have voided the Rx that printed out. Rx has been tee'd up and is ready for review.

## 2019-06-21 ENCOUNTER — Other Ambulatory Visit: Payer: Self-pay

## 2019-06-21 ENCOUNTER — Ambulatory Visit (INDEPENDENT_AMBULATORY_CARE_PROVIDER_SITE_OTHER): Payer: 59 | Admitting: Medical-Surgical

## 2019-06-21 ENCOUNTER — Encounter: Payer: Self-pay | Admitting: Medical-Surgical

## 2019-06-21 ENCOUNTER — Other Ambulatory Visit: Payer: Self-pay | Admitting: Medical-Surgical

## 2019-06-21 VITALS — BP 125/81 | HR 53 | Temp 98.0°F | Ht 67.5 in | Wt 186.9 lb

## 2019-06-21 DIAGNOSIS — R5382 Chronic fatigue, unspecified: Secondary | ICD-10-CM

## 2019-06-21 DIAGNOSIS — Z0000 Encounter for general adult medical examination without abnormal findings: Secondary | ICD-10-CM | POA: Diagnosis not present

## 2019-06-21 DIAGNOSIS — M25511 Pain in right shoulder: Secondary | ICD-10-CM | POA: Diagnosis not present

## 2019-06-21 DIAGNOSIS — M25512 Pain in left shoulder: Secondary | ICD-10-CM | POA: Diagnosis not present

## 2019-06-21 LAB — CBC
HCT: 45 % (ref 38.5–50.0)
Hemoglobin: 15.3 g/dL (ref 13.2–17.1)
MCH: 31.8 pg (ref 27.0–33.0)
MCHC: 34 g/dL (ref 32.0–36.0)
MCV: 93.6 fL (ref 80.0–100.0)
MPV: 8.9 fL (ref 7.5–12.5)
Platelets: 261 10*3/uL (ref 140–400)
RBC: 4.81 10*6/uL (ref 4.20–5.80)
RDW: 13.1 % (ref 11.0–15.0)
WBC: 4.5 10*3/uL (ref 3.8–10.8)

## 2019-06-21 LAB — LIPID PANEL
Cholesterol: 218 mg/dL — ABNORMAL HIGH (ref <200)
HDL: 65 mg/dL (ref >=40)
LDL Cholesterol (Calc): 137 mg/dL (calc) — ABNORMAL HIGH
Non-HDL Cholesterol (Calc): 153 mg/dL (calc) — ABNORMAL HIGH (ref <130)
Total CHOL/HDL Ratio: 3.4 (calc) (ref <5.0)
Triglycerides: 72 mg/dL (ref <150)

## 2019-06-21 LAB — COMPLETE METABOLIC PANEL WITH GFR
AG Ratio: 2.7 (calc) — ABNORMAL HIGH (ref 1.0–2.5)
ALT: 49 U/L — ABNORMAL HIGH (ref 9–46)
AST: 38 U/L (ref 10–40)
Albumin: 4.9 g/dL (ref 3.6–5.1)
Alkaline phosphatase (APISO): 38 U/L (ref 36–130)
BUN: 24 mg/dL (ref 7–25)
CO2: 28 mmol/L (ref 20–32)
Calcium: 9.9 mg/dL (ref 8.6–10.3)
Chloride: 101 mmol/L (ref 98–110)
Creat: 0.94 mg/dL (ref 0.60–1.35)
GFR, Est African American: 114 mL/min/{1.73_m2} (ref >=60)
GFR, Est Non African American: 98 mL/min/{1.73_m2} (ref >=60)
Globulin: 1.8 g/dL (calc) — ABNORMAL LOW (ref 1.9–3.7)
Glucose, Bld: 87 mg/dL (ref 65–99)
Potassium: 5.1 mmol/L (ref 3.5–5.3)
Sodium: 138 mmol/L (ref 135–146)
Total Bilirubin: 1.1 mg/dL (ref 0.2–1.2)
Total Protein: 6.7 g/dL (ref 6.1–8.1)

## 2019-06-21 LAB — VITAMIN D 25 HYDROXY (VIT D DEFICIENCY, FRACTURES): Vit D, 25-Hydroxy: 38 ng/mL (ref 30–100)

## 2019-06-21 LAB — TESTOSTERONE: Testosterone: 634 ng/dL (ref 250–827)

## 2019-06-21 LAB — VITAMIN B12: Vitamin B-12: 623 pg/mL (ref 200–1100)

## 2019-06-21 LAB — TSH: TSH: 1.51 mIU/L (ref 0.40–4.50)

## 2019-06-21 NOTE — Patient Instructions (Signed)
Preventive Care 45-45 Years Old, Male Preventive care refers to lifestyle choices and visits with your health care provider that can promote health and wellness. This includes:  A yearly physical exam. This is also called an annual well check.  Regular dental and eye exams.  Immunizations.  Screening for certain conditions.  Healthy lifestyle choices, such as eating a healthy diet, getting regular exercise, not using drugs or products that contain nicotine and tobacco, and limiting alcohol use. What can I expect for my preventive care visit? Physical exam Your health care provider will check:  Height and weight. These may be used to calculate body mass index (BMI), which is a measurement that tells if you are at a healthy weight.  Heart rate and blood pressure.  Your skin for abnormal spots. Counseling Your health care provider may ask you questions about:  Alcohol, tobacco, and drug use.  Emotional well-being.  Home and relationship well-being.  Sexual activity.  Eating habits.  Work and work Statistician. What immunizations do I need?  Influenza (flu) vaccine  This is recommended every year. Tetanus, diphtheria, and pertussis (Tdap) vaccine  You may need a Td booster every 10 years. Varicella (chickenpox) vaccine  You may need this vaccine if you have not already been vaccinated. Zoster (shingles) vaccine  You may need this after age 45. Measles, mumps, and rubella (MMR) vaccine  You may need at least one dose of MMR if you were born in 1957 or later. You may also need a second dose. Pneumococcal conjugate (PCV13) vaccine  You may need this if you have certain conditions and were not previously vaccinated. Pneumococcal polysaccharide (PPSV23) vaccine  You may need one or two doses if you smoke cigarettes or if you have certain conditions. Meningococcal conjugate (MenACWY) vaccine  You may need this if you have certain conditions. Hepatitis A  vaccine  You may need this if you have certain conditions or if you travel or work in places where you may be exposed to hepatitis A. Hepatitis B vaccine  You may need this if you have certain conditions or if you travel or work in places where you may be exposed to hepatitis B. Haemophilus influenzae type b (Hib) vaccine  You may need this if you have certain risk factors. Human papillomavirus (HPV) vaccine  If recommended by your health care provider, you may need three doses over 6 months. You may receive vaccines as individual doses or as more than one vaccine together in one shot (combination vaccines). Talk with your health care provider about the risks and benefits of combination vaccines. What tests do I need? Blood tests  Lipid and cholesterol levels. These may be checked every 5 years, or more frequently if you are over 60 years old.  Hepatitis C test.  Hepatitis B test. Screening  Lung cancer screening. You may have this screening every year starting at age 45 if you have a 30-pack-year history of smoking and currently smoke or have quit within the past 15 years.  Prostate cancer screening. Recommendations will vary depending on your family history and other risks.  Colorectal cancer screening. All adults should have this screening starting at age 45 and continuing until age 45. Your health care provider may recommend screening at age 45 if you are at increased risk. You will have tests every 1-10 years, depending on your results and the type of screening test.  Diabetes screening. This is done by checking your blood sugar (glucose) after you have not eaten  for a while (fasting). You may have this done every 1-3 years.  Sexually transmitted disease (STD) testing. Follow these instructions at home: Eating and drinking  Eat a diet that includes fresh fruits and vegetables, whole grains, lean protein, and low-fat dairy products.  Take vitamin and mineral supplements as  recommended by your health care provider.  Do not drink alcohol if your health care provider tells you not to drink.  If you drink alcohol: ? Limit how much you have to 0-2 drinks a day. ? Be aware of how much alcohol is in your drink. In the [place] one drink equals one 12 oz bottle of beer (355 mL), one 5 oz glass of wine (148 mL), or one 1 oz glass of hard liquor (44 mL). Lifestyle  Take daily care of your teeth and gums.  Stay active. Exercise for at least 30 minutes on 5 or more days each week.  Do not use any products that contain nicotine or tobacco, such as cigarettes, e-cigarettes, and chewing tobacco. If you need help quitting, ask your health care provider.  If you are sexually active, practice safe sex. Use a condom or other form of protection to prevent STIs (sexually transmitted infections).  Talk with your health care provider about taking a low-dose aspirin every day starting at age 45. What's next?  Go to your health care provider once a year for a well check visit.  Ask your health care provider how often you should have your eyes and teeth checked.  Stay up to date on all vaccines. This information is not intended to replace advice given to you by your health care provider. Make sure you discuss any questions you have with your health care provider. Document Revised: 04/06/2018 Document Reviewed: 04/06/2018 Elsevier Patient Education  2020 [employer].

## 2019-06-21 NOTE — Progress Notes (Signed)
HPI: Albert Knight is a 44 y.o. male who  has a past medical history of Allergy, History of recurrent ear infection, Hypertension, Mixed hyperlipidemia (05/17/2018), Plantar warts, and Pneumonia.  he presents to [facility] today, 06/21/19,  for chief complaint of:  Annual physical exam- due for eye exam, last 1 several years ago.  Sees dentist twice yearly for routine cleanings.  Eating a modified keto diet and exercising 5-6 days per week.  Chronic fatigue- doing fairly well but does continue to have some fatigue.  Feels this may be related to difficulty sleeping several nights per week.  Would like to have his testosterone checked to make sure this is still within normal range.  Bilateral shoulder discomfort-intermittent bilateral shoulder joint discomfort described as mild aching for several months.  No limitation of activity or range of motion.  Seems to be better when sleeping on his back.  Patient wondering if this could be related to taking Lipitor.  Past medical, surgical, social and family history reviewed:  Patient Active Problem List   Diagnosis Date Noted  . Mixed hyperlipidemia 05/17/2018  . Environmental allergies 05/16/2018  . White coat syndrome without diagnosis of hypertension 05/16/2018  . Chronic fatigue 05/16/2018    Past Surgical History:  Procedure Laterality Date  . CLAVICLE SURGERY    . NASAL FRACTURE SURGERY    . NOSE SURGERY    . SHOULDER SURGERY      Social History   Tobacco Use  . Smoking status: Never Smoker  . Smokeless tobacco: Never Used  Substance Use Topics  . Alcohol use: Not Currently    Comment: 1-2 per month     Family History  Problem Relation Age of Onset  . Hypertension Mother   . Hyperlipidemia Mother   . Breast cancer Mother   . Hypertension Father   . Hyperlipidemia Father   . Heart attack Maternal Grandfather   . Heart attack Paternal Grandmother   . Heart attack Paternal Grandfather       Current medication list and allergy/intolerance information reviewed:    Current Outpatient Medications  Medication Sig Dispense Refill  . aspirin EC 81 MG tablet Take 81 mg by mouth daily.    Marland Kitchen atorvastatin (LIPITOR) 20 MG tablet TAKE 1 TABLET BY MOUTH  DAILY 90 tablet 1  . cetirizine (ZYRTEC) 10 MG tablet Take 10 mg by mouth daily.    . CHONDROITIN SULFATE PO Take 1 tablet by mouth daily.    . fluticasone (FLONASE) 50 MCG/ACT nasal spray Place 1 spray into both nostrils daily.    . Melatonin 5 MG CHEW Chew 1 tablet by mouth at bedtime.    . Omega-3 Fatty Acids (FISH OIL PO) Take 1 capsule by mouth daily.    Marland Kitchen UNABLE TO FIND ZMA Sports Supplement    . VITAMIN D PO Take 1 tablet by mouth daily.    . Suvorexant 10 MG TABS Take 1 tablet by mouth at bedtime as needed (for sleep). (Patient not taking: Reported on 06/21/2019) 30 tablet 2   No current facility-administered medications for this visit.    No Known Allergies    Review of Systems:  Constitutional:  No  fever, no chills, No recent illness, No unintentional weight changes. No significant fatigue.   HEENT: No  headache, no vision change, no hearing change, No sore throat, No  sinus pressure  Cardiac: No  chest pain, No  pressure, No palpitations, No  Orthopnea  Respiratory:  No  shortness of breath. No  Cough  Gastrointestinal: No  abdominal pain, No  nausea, No  vomiting,  No  blood in stool, No  diarrhea, No  constipation   Musculoskeletal: Bilateral shoulder aches, intermittent.  Skin: No  Rash, No other wounds/concerning lesions  Genitourinary: No  incontinence, No  abnormal genital bleeding, No abnormal genital discharge  Hem/Onc: No  easy bruising/bleeding, No  abnormal lymph node  Endocrine: No cold intolerance,  No heat intolerance. No polyuria/polydipsia/polyphagia   Neurologic: No  weakness, No  dizziness, No  slurred speech/focal weakness/facial droop  Psychiatric: No  concerns with depression, No   concerns with anxiety, No mood problems. Periodic difficulty with sleeping, most often on nights he has his kids with him.  Exam:  BP 125/81   Pulse (!) 53   Temp 98 F (36.7 C) (Oral)   Ht 5' 7.5" (1.715 m)   Wt 186 lb 14.4 oz (84.8 kg)   SpO2 99%   BMI 28.84 kg/m   Constitutional: VS see above. General Appearance: alert, well-developed, well-nourished, NAD  Eyes: Normal lids and conjunctive, non-icteric sclera  Ears, Nose, Mouth, Throat: MMM, Normal external inspection ears/nares/mouth/lips/gums. TM normal bilaterally.  Oral and nasal examination deferred due to Covid precautions.  Neck: No masses, trachea midline. No thyroid enlargement. No tenderness/mass appreciated. No lymphadenopathy  Respiratory: Normal respiratory effort. no wheeze, no rhonchi, no rales  Cardiovascular: S1/S2 normal, no murmur, no rub/gallop auscultated. RRR. No lower extremity edema. Pedal pulse II/IV bilaterally DP and PT. No carotid bruit or JVD. No abdominal aortic bruit.  Gastrointestinal: Nontender, no masses. No hepatomegaly, no splenomegaly. No hernia appreciated. Bowel sounds normal. Rectal exam deferred.   Musculoskeletal: Gait normal. No clubbing/cyanosis of digits.   Neurological: Normal balance/coordination. No tremor. No cranial nerve deficit on limited exam. Motor and sensation intact and symmetric. Cerebellar reflexes intact.   Skin: warm, dry, intact. No rash/ulcer. No concerning nevi or subq nodules on limited exam.    Psychiatric: Normal judgment/insight. Normal mood and affect. Oriented x3.   ASSESSMENT/PLAN:   Annual physical exam Lab orders already in.  Advised patient to have those drawn today.  Patient to schedule eye exam.  Chronic fatigue Adding testosterone level to lab work.  Start Belsomra to help with insomnia as instructed.  Bilateral shoulder discomfort Suspect mild arthritis vs overuse. Low suspicion for relation to Lipitor.  Continue conservative treatment with  heat/ice, Tylenol, ibuprofen, stretching/exercises.  If no improvement or symptoms worsen, follow-up with Dr. Darene Lamer for further evaluation.   Orders Placed This Encounter  Procedures  . Testosterone    Patient Instructions  Preventive Care 41-61 Years Old, Male Preventive care refers to lifestyle choices and visits with your health care provider that can promote health and wellness. This includes:  A yearly physical exam. This is also called an annual well check.  Regular dental and eye exams.  Immunizations.  Screening for certain conditions.  Healthy lifestyle choices, such as eating a healthy diet, getting regular exercise, not using drugs or products that contain nicotine and tobacco, and limiting alcohol use. What can I expect for my preventive care visit? Physical exam Your health care provider will check:  Height and weight. These may be used to calculate body mass index (BMI), which is a measurement that tells if you are at a healthy weight.  Heart rate and blood pressure.  Your skin for abnormal spots. Counseling Your health care provider may ask you questions about:  Alcohol, tobacco, and drug  use.  Emotional well-being.  Home and relationship well-being.  Sexual activity.  Eating habits.  Work and work Statistician. What immunizations do I need?  Influenza (flu) vaccine  This is recommended every year. Tetanus, diphtheria, and pertussis (Tdap) vaccine  You may need a Td booster every 10 years. Varicella (chickenpox) vaccine  You may need this vaccine if you have not already been vaccinated. Zoster (shingles) vaccine  You may need this after age 60. Measles, mumps, and rubella (MMR) vaccine  You may need at least one dose of MMR if you were born in 1957 or later. You may also need a second dose. Pneumococcal conjugate (PCV13) vaccine  You may need this if you have certain conditions and were not previously vaccinated. Pneumococcal polysaccharide  (PPSV23) vaccine  You may need one or two doses if you smoke cigarettes or if you have certain conditions. Meningococcal conjugate (MenACWY) vaccine  You may need this if you have certain conditions. Hepatitis A vaccine  You may need this if you have certain conditions or if you travel or work in places where you may be exposed to hepatitis A. Hepatitis B vaccine  You may need this if you have certain conditions or if you travel or work in places where you may be exposed to hepatitis B. Haemophilus influenzae type b (Hib) vaccine  You may need this if you have certain risk factors. Human papillomavirus (HPV) vaccine  If recommended by your health care provider, you may need three doses over 6 months. You may receive vaccines as individual doses or as more than one vaccine together in one shot (combination vaccines). Talk with your health care provider about the risks and benefits of combination vaccines. What tests do I need? Blood tests  Lipid and cholesterol levels. These may be checked every 5 years, or more frequently if you are over 28 years old.  Hepatitis C test.  Hepatitis B test. Screening  Lung cancer screening. You may have this screening every year starting at age 15 if you have a 30-pack-year history of smoking and currently smoke or have quit within the past 15 years.  Prostate cancer screening. Recommendations will vary depending on your family history and other risks.  Colorectal cancer screening. All adults should have this screening starting at age 41 and continuing until age 78. Your health care provider may recommend screening at age 15 if you are at increased risk. You will have tests every 1-10 years, depending on your results and the type of screening test.  Diabetes screening. This is done by checking your blood sugar (glucose) after you have not eaten for a while (fasting). You may have this done every 1-3 years.  Sexually transmitted disease (STD)  testing. Follow these instructions at home: Eating and drinking  Eat a diet that includes fresh fruits and vegetables, whole grains, lean protein, and low-fat dairy products.  Take vitamin and mineral supplements as recommended by your health care provider.  Do not drink alcohol if your health care provider tells you not to drink.  If you drink alcohol: ? Limit how much you have to 0-2 drinks a day. ? Be aware of how much alcohol is in your drink. In the [place] one drink equals one 12 oz bottle of beer (355 mL), one 5 oz glass of wine (148 mL), or one 1 oz glass of hard liquor (44 mL). Lifestyle  Take daily care of your teeth and gums.  Stay active. Exercise for at least 30 minutes  on 5 or more days each week.  Do not use any products that contain nicotine or tobacco, such as cigarettes, e-cigarettes, and chewing tobacco. If you need help quitting, ask your health care provider.  If you are sexually active, practice safe sex. Use a condom or other form of protection to prevent STIs (sexually transmitted infections).  Talk with your health care provider about taking a low-dose aspirin every day starting at age 10. What's next?  Go to your health care provider once a year for a well check visit.  Ask your health care provider how often you should have your eyes and teeth checked.  Stay up to date on all vaccines. This information is not intended to replace advice given to you by your health care provider. Make sure you discuss any questions you have with your health care provider. Document Revised: 04/06/2018 Document Reviewed: 04/06/2018 Elsevier Patient Education  [place].   Follow-up plan: Return in about 1 year (around 06/20/2020) for annual  physical exam.  Clearnce Sorrel, DNP, APRN, FNP-BC [place] [facility]

## 2019-07-16 ENCOUNTER — Telehealth: Payer: Self-pay

## 2019-07-16 DIAGNOSIS — G479 Sleep disorder, unspecified: Secondary | ICD-10-CM

## 2019-07-16 MED ORDER — ESZOPICLONE 1 MG PO TABS: 1.0000 mg | ORAL_TABLET | Freq: Every evening | ORAL | 1 refills | Status: DC | PRN

## 2019-07-16 NOTE — Telephone Encounter (Signed)
Discontinue Belsomra. Prescribing Lunesta 1mg  nightly at bedtime as needed for sleep. Sent to mail order pharmacy.

## 2019-07-16 NOTE — Telephone Encounter (Signed)
Pt called requesting a Rx change from Belsomra to generic Lunesta. Pt states Belsomra is $338 for 1 month and generic Alfonso Patten is $18 for a 3 month supply. He states the "Belsomra is way too expensive compared to what it is doing for him."

## 2019-07-17 NOTE — Telephone Encounter (Signed)
Pt aware to discontinue Belsomra and that a Rx for generic Alfonso Patten has been sent to his pharmacy as requested. No further questions or concerns at this time.

## 2019-12-10 ENCOUNTER — Other Ambulatory Visit: Payer: Self-pay | Admitting: Medical-Surgical

## 2019-12-10 DIAGNOSIS — G479 Sleep disorder, unspecified: Secondary | ICD-10-CM

## 2019-12-10 NOTE — Telephone Encounter (Signed)
Pt states it is working well for him to go to sleep but he is still having problems with tossing and turning through the night. Pt states he does not want to change meds at this time.

## 2019-12-10 NOTE — Telephone Encounter (Signed)
Please reach out to patient:  Is the Lunesta working well for insomnia? If not, please schedule for visit (ok to be virtual) so we can discuss further options.  Thanks, Ander Slade

## 2019-12-21 ENCOUNTER — Telehealth (INDEPENDENT_AMBULATORY_CARE_PROVIDER_SITE_OTHER): Payer: 59 | Admitting: Medical-Surgical

## 2019-12-21 ENCOUNTER — Encounter: Payer: Self-pay | Admitting: Medical-Surgical

## 2019-12-21 DIAGNOSIS — J329 Chronic sinusitis, unspecified: Secondary | ICD-10-CM

## 2019-12-21 DIAGNOSIS — J40 Bronchitis, not specified as acute or chronic: Secondary | ICD-10-CM

## 2019-12-21 MED ORDER — AZITHROMYCIN 250 MG PO TABS: ORAL_TABLET | ORAL | 0 refills | Status: DC

## 2019-12-21 NOTE — Progress Notes (Signed)
Virtual Visit via Video Note  I connected with Albert Knight on 12/21/19 at  1:00 PM EDT by a video enabled telemedicine application and verified that I am speaking with the correct person using two identifiers.   I discussed the limitations of evaluation and management by telemedicine and the availability of in person appointments. The patient expressed understanding and agreed to proceed.  Patient location: home Provider locations: office  Subjective:    CC: Sore throat  HPI: Pleasant 44 year old male presenting today with complaints of 1 week of upper respiratory symptoms including sore throat, sinus congestion, postnasal drip, ear pressure, cough productive of brown mucus that has since changed to green, and fatigue.  Symptoms started after bringing home and flowering plant on Friday.  The flowering plant bloom the next day and he developed a sore throat.  He originally got his symptoms were related to allergies but they have not responded to his typical Flonase and oral antihistamine.  He has been taking Sudafed since Tuesday evening as needed.  Notes taking Benadryl at night starting on Wednesday to help him sleep.  Alternating Tylenol and ibuprofen as needed.  Has had a low-grade temperature in the 99 range.  Has not been Covid tested and has not had his Covid vaccines.  Denies chills, chest pain, and shortness of breath.  Past medical history, Surgical history, Family history not pertinant except as noted below, Social history, Allergies, and medications have been entered into the medical record, reviewed, and corrections made.   Review of Systems: See HPI for pertinent positives and negatives.   Objective:    General: Speaking clearly in complete sentences without any shortness of breath.  Alert and oriented x3.  Normal judgment. No apparent acute distress.  Impression and Recommendations:    1. Sinobronchitis Possible allergy exacerbation from the flowering plant but with fever  and poor response to antihistamines, suspect this is an upper respiratory infection that has transitioned to sinobronchitis.  Recommend Covid testing although patient said he is resistant to having this done.  For now, sending in azithromycin.  Recommend conservative treatment including Tylenol, ibuprofen, Sudafed, Benadryl, and his normal oral antihistamines.  Discussed sore throat remedies/home treatments that may be beneficial.  If no improvement by Monday on Z-Pak, may benefit from a short course of oral steroids to help reduce inflammation from allergic response to his new plant.  20 minutes of non-face-to-face time was provided during this encounter.  Return if symptoms worsen or fail to improve.  I discussed the assessment and treatment plan with the patient. The patient was provided an opportunity to ask questions and all were answered. The patient agreed with the plan and demonstrated an understanding of the instructions.   The patient was advised to call back or seek an in-person evaluation if the symptoms worsen or if the condition fails to improve as anticipated.  Thayer Ohm, DNP, APRN, FNP-BC [facility]

## 2019-12-24 ENCOUNTER — Telehealth: Payer: Self-pay

## 2019-12-24 DIAGNOSIS — J329 Chronic sinusitis, unspecified: Secondary | ICD-10-CM

## 2019-12-24 MED ORDER — PREDNISONE 50 MG PO TABS: 50.0000 mg | ORAL_TABLET | Freq: Every day | ORAL | 0 refills | Status: DC

## 2019-12-24 MED FILL — predniSONE 50 MG TABS: 50 | 5 days supply | Qty: 5 | Fill #0

## 2019-12-24 NOTE — Telephone Encounter (Addendum)
Pt aware that the Rx has been sent to the pharmacy. Pt is concerned about the mucus having blood in it. He said this started on Saturday. It has been clear with dark pink streaks in it. Pt states after we spoke this morning that the mucus changed and the color is now dark read and is of a thicker consistency. Pt is concerned about this and would like to know what Joy thinks. I offered him an OV tomorrow morning and declined at this time stating he wants to know what she thinks via telephone call first.

## 2019-12-24 NOTE — Telephone Encounter (Signed)
The red/pink in his sputum is likely related to coughing and the irritation of the tissues. Sometimes capillaries located in the shallow tissues will rupture and cause blood to show up in sputum. This is usually harmless and can be treated with avoidance of cough (using cough meds/drops) and using a humidifier by the bed at night. If it continues to worsen or if he develops shortness of breath, chest pain, high fever that doesn't respond to tylenol/ibuprofen, etc. he will need to be evaluated in person.

## 2019-12-24 NOTE — Telephone Encounter (Signed)
Pt called requesting the Rx for the short course of oral steroids that he wanted to hold off on from his 12/21/2019 VV to be called in to his pharmacy. Pt states he is not feeling much better, and now has a productive cough of clear mucus with some dark pink streaks in it. Pt said he is going to get COVID tested tomorrow.

## 2019-12-24 NOTE — Telephone Encounter (Signed)
Prednisone 50mg  daily x 5 sent to pharmacy [facility]). Please let [facility] know the results of the COVID test.

## 2019-12-25 ENCOUNTER — Telehealth (HOSPITAL_COMMUNITY): Payer: Self-pay | Admitting: Family

## 2019-12-25 DIAGNOSIS — U071 COVID-19: Secondary | ICD-10-CM

## 2019-12-25 NOTE — Telephone Encounter (Signed)
Pt aware. He states he tested positive for COVID yesterday and is now on an atbx, hydroxychloroquine, and a cough syrup so he should be "on the mend" real soon.

## 2019-12-25 NOTE — Telephone Encounter (Signed)
Called to discuss with Albert Knight about Covid symptoms and the use of casirivimab/imdevimab, a combination monoclonal antibody infusion for those with mild to moderate Covid symptoms and at a high risk of hospitalization.     Pt is qualified for this infusion at the [facility] infusion center due to co-morbid conditions and/or a member of an at-risk group, however declines infusion at this time as he does not have child care so that he can come . Symptoms tier reviewed as well as criteria for ending isolation.  Symptoms reviewed that would warrant ED/Hospital evaluation. Preventative practices reviewed. Patient verbalized understanding. Patient advised to go to Urgent care or ED with severe symptoms. Last date he would be eligible for infusion is 9/1 as symptoms began 9/23.    Patient Active Problem List   Diagnosis Date Noted  . Mixed hyperlipidemia 05/17/2018  . Environmental allergies 05/16/2018  . White coat syndrome without diagnosis of hypertension 05/16/2018  . Chronic fatigue 05/16/2018    Akemi Overholser,NP

## 2019-12-26 ENCOUNTER — Ambulatory Visit (HOSPITAL_COMMUNITY): Payer: 59

## 2020-02-04 ENCOUNTER — Other Ambulatory Visit: Payer: Self-pay

## 2020-02-04 DIAGNOSIS — Z20822 Contact with and (suspected) exposure to covid-19: Secondary | ICD-10-CM

## 2020-02-05 LAB — NOVEL CORONAVIRUS, NAA: SARS-CoV-2, NAA: NOT DETECTED

## 2020-02-05 LAB — SARS-COV-2, NAA 2 DAY TAT

## 2020-03-11 ENCOUNTER — Other Ambulatory Visit: Payer: Self-pay | Admitting: Medical-Surgical

## 2020-03-11 DIAGNOSIS — G479 Sleep disorder, unspecified: Secondary | ICD-10-CM

## 2020-04-10 ENCOUNTER — Other Ambulatory Visit: Payer: Self-pay | Admitting: Medical-Surgical

## 2020-04-10 DIAGNOSIS — G479 Sleep disorder, unspecified: Secondary | ICD-10-CM

## 2020-05-05 ENCOUNTER — Other Ambulatory Visit: Payer: Self-pay | Admitting: Medical-Surgical

## 2020-05-05 DIAGNOSIS — E782 Mixed hyperlipidemia: Secondary | ICD-10-CM

## 2020-05-21 ENCOUNTER — Other Ambulatory Visit: Payer: Self-pay | Admitting: Medical-Surgical

## 2020-05-21 DIAGNOSIS — E782 Mixed hyperlipidemia: Secondary | ICD-10-CM

## 2020-05-26 ENCOUNTER — Other Ambulatory Visit: Payer: Self-pay | Admitting: Medical-Surgical

## 2020-05-26 DIAGNOSIS — G479 Sleep disorder, unspecified: Secondary | ICD-10-CM

## 2020-06-02 ENCOUNTER — Telehealth: Payer: Self-pay | Admitting: Medical-Surgical

## 2020-06-02 NOTE — Telephone Encounter (Signed)
Pt called. He has scheduled a physical for February 18th and would [facility]  lab order sent down on February 11th to include bloodwork to check his testosterone level.  Thank you.

## 2020-06-03 ENCOUNTER — Other Ambulatory Visit: Payer: Self-pay

## 2020-06-03 ENCOUNTER — Encounter: Payer: Self-pay | Admitting: Medical-Surgical

## 2020-06-03 DIAGNOSIS — R5382 Chronic fatigue, unspecified: Secondary | ICD-10-CM

## 2020-06-03 DIAGNOSIS — Z0000 Encounter for general adult medical examination without abnormal findings: Secondary | ICD-10-CM

## 2020-06-03 DIAGNOSIS — Z1329 Encounter for screening for other suspected endocrine disorder: Secondary | ICD-10-CM

## 2020-06-03 DIAGNOSIS — E782 Mixed hyperlipidemia: Secondary | ICD-10-CM

## 2020-06-03 NOTE — Addendum Note (Signed)
Addended by: Thermon Leyland on: 06/03/2020 10:21 AM   Modules accepted: Orders

## 2020-06-03 NOTE — Telephone Encounter (Signed)
Pt aware lab orders for his physical, and orders to check his testosterone level, vitamin B-12 and vitamin D have been entered. I told him that he would need to come in fasting and first thing in the morning to get a good specimen to check the testosterone level. Pt also mentioned something about having a microcellular test done and said he would send a portal messing with information about the test. .No further questions or concerns at this time.

## 2020-06-04 ENCOUNTER — Other Ambulatory Visit: Payer: Self-pay | Admitting: Medical-Surgical

## 2020-06-04 DIAGNOSIS — E782 Mixed hyperlipidemia: Secondary | ICD-10-CM

## 2020-06-06 LAB — CBC WITH DIFFERENTIAL/PLATELET
Absolute Monocytes: 594 cells/uL (ref 200–950)
Basophils Absolute: 50 cells/uL (ref 0–200)
Basophils Relative: 0.9 %
Eosinophils Absolute: 77 cells/uL (ref 15–500)
Eosinophils Relative: 1.4 %
HCT: 48.9 % (ref 38.5–50.0)
Hemoglobin: 16.9 g/dL (ref 13.2–17.1)
Lymphs Abs: 1441 cells/uL (ref 850–3900)
MCH: 31.9 pg (ref 27.0–33.0)
MCHC: 34.6 g/dL (ref 32.0–36.0)
MCV: 92.3 fL (ref 80.0–100.0)
MPV: 9.2 fL (ref 7.5–12.5)
Monocytes Relative: 10.8 %
Neutro Abs: 3339 cells/uL (ref 1500–7800)
Neutrophils Relative %: 60.7 %
Platelets: 288 10*3/uL (ref 140–400)
RBC: 5.3 10*6/uL (ref 4.20–5.80)
RDW: 12 % (ref 11.0–15.0)
Total Lymphocyte: 26.2 %
WBC: 5.5 10*3/uL (ref 3.8–10.8)

## 2020-06-06 LAB — COMPLETE METABOLIC PANEL WITH GFR
AG Ratio: 2.6 (calc) — ABNORMAL HIGH (ref 1.0–2.5)
ALT: 29 U/L (ref 9–46)
AST: 30 U/L (ref 10–40)
Albumin: 4.9 g/dL (ref 3.6–5.1)
Alkaline phosphatase (APISO): 51 U/L (ref 36–130)
BUN: 23 mg/dL (ref 7–25)
CO2: 32 mmol/L (ref 20–32)
Calcium: 9.7 mg/dL (ref 8.6–10.3)
Chloride: 102 mmol/L (ref 98–110)
Creat: 0.95 mg/dL (ref 0.60–1.35)
GFR, Est African American: 112 mL/min/{1.73_m2} (ref >=60)
GFR, Est Non African American: 96 mL/min/{1.73_m2} (ref >=60)
Globulin: 1.9 g/dL (calc) (ref 1.9–3.7)
Glucose, Bld: 96 mg/dL (ref 65–99)
Potassium: 4 mmol/L (ref 3.5–5.3)
Sodium: 141 mmol/L (ref 135–146)
Total Bilirubin: 0.9 mg/dL (ref 0.2–1.2)
Total Protein: 6.8 g/dL (ref 6.1–8.1)

## 2020-06-06 LAB — LIPID PANEL
Cholesterol: 180 mg/dL (ref <200)
HDL: 67 mg/dL (ref >=40)
LDL Cholesterol (Calc): 101 mg/dL (calc) — ABNORMAL HIGH
Non-HDL Cholesterol (Calc): 113 mg/dL (calc) (ref <130)
Total CHOL/HDL Ratio: 2.7 (calc) (ref <5.0)
Triglycerides: 41 mg/dL (ref <150)

## 2020-06-06 LAB — VITAMIN D 25 HYDROXY (VIT D DEFICIENCY, FRACTURES): Vit D, 25-Hydroxy: 58 ng/mL (ref 30–100)

## 2020-06-06 LAB — VITAMIN B12: Vitamin B-12: 1381 pg/mL — ABNORMAL HIGH (ref 200–1100)

## 2020-06-10 ENCOUNTER — Other Ambulatory Visit: Payer: Self-pay | Admitting: Medical-Surgical

## 2020-06-10 NOTE — Telephone Encounter (Signed)
Please advise 

## 2020-06-12 ENCOUNTER — Other Ambulatory Visit: Payer: Self-pay | Admitting: Medical-Surgical

## 2020-06-13 ENCOUNTER — Encounter: Payer: Self-pay | Admitting: *Deleted

## 2020-06-13 ENCOUNTER — Encounter: Payer: 59 | Admitting: Medical-Surgical

## 2020-06-13 ENCOUNTER — Telehealth: Payer: Self-pay

## 2020-06-13 DIAGNOSIS — Z0000 Encounter for general adult medical examination without abnormal findings: Secondary | ICD-10-CM

## 2020-06-13 NOTE — Telephone Encounter (Signed)
Pt aware of Joy's response. He said that he did not know if he would be coming back for the appointment in a month, that he will most likely be looking for a new PCP.

## 2020-06-13 NOTE — Telephone Encounter (Signed)
We will be continuing the requirement for masks within our health system even if our county lifts the mask mandate. This is to protect both patients and staff. I do not have a definitive date or estimated timeline for this requirement but I suspect it will be long term.

## 2020-06-13 NOTE — Telephone Encounter (Signed)
Pt called regarding his appt for a CPE this afternoon at 1:40 stating his testosterone and insulin, free labs have not resulted yet. He was asking if he needed to reschedule the appointment until the results have come in. He said that he was only doing the physical to meet his FSA requirements and to have labs done. I spoke with Ander Slade and told him that it is up to him if he wants to reschedule the appt, but Ander Slade will not be able to complete any biometrics form until he comes in for a physical. Pt said that he understood this but wanted to reschedule the appt. He asked if he would have to wear a mask when he came in and I told him that right now it is [facility] policy that masks are to be worn on all [facility] campuses and he said he would wait until the mask mandate is lifted then come in and in the meantime discuss lab results through [employer]. I asked if he wanted to go ahead and reschedule the appt today so he could pick a best time that works for his schedule and he was agreeable to rescheduling to 07/11/2020 at 8:30 with an 8:15 check in time.   Pt said he wanted to let Joy know that he stopped taking his statin Rx at Thanksgiving and started taking a supplement called CholestAway and that with his recent lipid panel results it is working well. He said that we did not need to send refills of his atorvastatin to the pharmacy.

## 2020-06-15 LAB — TESTOSTERONE, FREE & TOTAL
Free Testosterone: 55.3 pg/mL (ref 35.0–155.0)
Testosterone, Total, LC-MS-MS: 482 ng/dL (ref 250–1100)

## 2020-06-18 LAB — INSULIN, FREE (BIOACTIVE): Insulin, Free: 2.7 u[IU]/mL (ref 1.5–14.9)

## 2020-06-23 ENCOUNTER — Other Ambulatory Visit: Payer: Self-pay | Admitting: Medical-Surgical

## 2020-06-23 DIAGNOSIS — G479 Sleep disorder, unspecified: Secondary | ICD-10-CM

## 2020-06-23 NOTE — Telephone Encounter (Signed)
Pt scheduled for OV on 07/11/2020, however per 06/13/2020 he stated he may not be coming in for that OV and may be looking for a new PCP. Rx tee'd up below for #30 day supply with no refills and is ready for review and approval/denial. I put a note on the Rx that he must keep the OV scheduled for 07/11/20 for additional refills.

## 2020-07-11 ENCOUNTER — Encounter: Payer: 59 | Admitting: Medical-Surgical

## 2021-09-22 ENCOUNTER — Encounter (HOSPITAL_BASED_OUTPATIENT_CLINIC_OR_DEPARTMENT_OTHER): Payer: Self-pay | Admitting: Emergency Medicine

## 2021-09-22 ENCOUNTER — Other Ambulatory Visit: Payer: Self-pay

## 2021-09-22 ENCOUNTER — Emergency Department (HOSPITAL_BASED_OUTPATIENT_CLINIC_OR_DEPARTMENT_OTHER)
Admission: EM | Admit: 2021-09-22 | Discharge: 2021-09-22 | Disposition: A | Discharge status: Home / Self Care | Payer: 59 | Attending: Emergency Medicine | Admitting: Emergency Medicine

## 2021-09-22 ENCOUNTER — Encounter (HOSPITAL_COMMUNITY): Payer: Self-pay

## 2021-09-22 ENCOUNTER — Emergency Department (HOSPITAL_BASED_OUTPATIENT_CLINIC_OR_DEPARTMENT_OTHER): Payer: 59

## 2021-09-22 DIAGNOSIS — I480 Paroxysmal atrial fibrillation: Secondary | ICD-10-CM | POA: Diagnosis not present

## 2021-09-22 DIAGNOSIS — I10 Essential (primary) hypertension: Secondary | ICD-10-CM | POA: Insufficient documentation

## 2021-09-22 DIAGNOSIS — Z79899 Other long term (current) drug therapy: Secondary | ICD-10-CM | POA: Insufficient documentation

## 2021-09-22 DIAGNOSIS — R0789 Other chest pain: Secondary | ICD-10-CM | POA: Diagnosis present

## 2021-09-22 DIAGNOSIS — Z7982 Long term (current) use of aspirin: Secondary | ICD-10-CM | POA: Diagnosis not present

## 2021-09-22 DIAGNOSIS — I4891 Unspecified atrial fibrillation: Secondary | ICD-10-CM

## 2021-09-22 LAB — BASIC METABOLIC PANEL
Anion gap: 10 (ref 5–15)
BUN: 24 mg/dL — ABNORMAL HIGH (ref 6–20)
CO2: 28 mmol/L (ref 22–32)
Calcium: 9.3 mg/dL (ref 8.9–10.3)
Chloride: 101 mmol/L (ref 98–111)
Creatinine, Ser: 1.23 mg/dL (ref 0.61–1.24)
GFR, Estimated: >60 mL/min (ref >60)
Glucose, Bld: 112 mg/dL — ABNORMAL HIGH (ref 70–99)
Potassium: 4.1 mmol/L (ref 3.5–5.1)
Sodium: 139 mmol/L (ref 135–145)

## 2021-09-22 LAB — TROPONIN I (HIGH SENSITIVITY)
Troponin I (High Sensitivity): 21 ng/L — ABNORMAL HIGH (ref <18)
Troponin I (High Sensitivity): 20 ng/L — ABNORMAL HIGH (ref <18)

## 2021-09-22 LAB — CBC
HCT: 47.2 % (ref 39.0–52.0)
Hemoglobin: 16.1 g/dL (ref 13.0–17.0)
MCH: 31.6 pg (ref 26.0–34.0)
MCHC: 34.1 g/dL (ref 30.0–36.0)
MCV: 92.7 fL (ref 80.0–100.0)
Platelets: 319 10*3/uL (ref 150–400)
RBC: 5.09 MIL/uL (ref 4.22–5.81)
RDW: 14 % (ref 11.5–15.5)
WBC: 7.1 10*3/uL (ref 4.0–10.5)
nRBC: 0 % (ref 0.0–0.2)

## 2021-09-22 LAB — MAGNESIUM: Magnesium: 2.2 mg/dL (ref 1.7–2.4)

## 2021-09-22 MED ORDER — ASPIRIN 81 MG PO CHEW: 324.0000 mg | CHEWABLE_TABLET | Freq: Once | ORAL | Status: DC

## 2021-09-22 NOTE — Discharge Instructions (Signed)
Please follow-up in the cardiology clinic.  Please rest and stay hydrated.  If any symptoms change, return, worsen, please return to the nearest emergency department.

## 2021-09-22 NOTE — ED Provider Notes (Signed)
[facility] EMERGENCY DEPARTMENT Provider Note   CSN: [id number] Arrival date & time: 09/22/21  1312     History  Chief Complaint  Patient presents with   Chest Pain    Albert Knight is a 46 y.o. male.  Patient as above with significant medical history as below, including hypertension, hyperlipidemia who presents to the ED with complaint of normal heart rate on Fitbit.  Patient reports over the past 3 days his Fitbit has alarming that he has been in a rapid heart rate, possible A-fib.  He has been feeling tired, mild chest tightness at that time.  No palpitations or significant chest pain.  No lightheadedness or near syncope, no diaphoresis, nausea or vomiting.  No recent stimulant use.  Does report he was recent on a cruise and did just more alcohol than normal but no illicit drug use.  Does not smoke cigarettes.  Patient reports he was previously on medication for high blood pressure and high cholesterol but after diet/lifestyle changes he has been able to come off the medications.     Past Medical History:  Diagnosis Date   Allergy    History of recurrent ear infection    Hypertension    Mixed hyperlipidemia 05/17/2018   Plantar warts    Pneumonia     Past Surgical History:  Procedure Laterality Date   CLAVICLE SURGERY     NASAL FRACTURE SURGERY     NOSE SURGERY     SHOULDER SURGERY       The history is provided by the patient and the spouse. No language interpreter was used.  Chest Pain Associated symptoms: fatigue   Associated symptoms: no abdominal pain, no cough, no dysphagia, no fever, no headache, no nausea, no palpitations, no shortness of breath and no vomiting       Home Medications Prior to Admission medications   Medication Sig Start Date End Date Taking? Authorizing Provider  aspirin EC 81 MG tablet Take 81 mg by mouth daily.    [provider]  atorvastatin (LIPITOR) 20 MG tablet Take 1 tablet (20 mg total) by mouth daily. 06/05/20    Faige Seely Bouche, NP  azithromycin (ZITHROMAX) 250 MG tablet Take 2 tablets (500mg ) on day 1 then take 1 tablet (250mg ) daily on days 2-5. 12/21/19   Tavon Magnussen Bouche, NP  cetirizine (ZYRTEC) 10 MG tablet Take 10 mg by mouth daily.    [provider]  CHONDROITIN SULFATE PO Take 1 tablet by mouth daily.    [provider]  eszopiclone (LUNESTA) 1 MG TABS tablet Take 1 tablet (1 mg total) by mouth at bedtime as needed. **PATIENT MUST KEEP OFFICE VISIT ON 07/11/2020 FOR ADDITIONAL REFILLS** 06/23/20   Massimiliano Rohleder Bouche, NP  fluticasone (FLONASE) 50 MCG/ACT nasal spray Place 1 spray into both nostrils daily.    [provider]  Melatonin 5 MG CHEW Chew 1 tablet by mouth at bedtime.    [provider]  Omega-3 Fatty Acids (FISH OIL PO) Take 1 capsule by mouth daily.    [provider]  predniSONE (DELTASONE) 50 MG tablet Take 1 tablet (50 mg total) by mouth daily. 12/24/19   Caleel Kiner Bouche, NP  UNABLE TO FIND ZMA Sports Supplement    [provider]  VITAMIN D PO Take 1 tablet by mouth daily.    [provider]      Allergies    Patient has no known allergies.    Review of Systems   Review of  Systems  Constitutional:  Positive for fatigue. Negative for chills and fever.  HENT:  Negative for facial swelling and trouble swallowing.   Eyes:  Negative for photophobia and visual disturbance.  Respiratory:  Positive for chest tightness. Negative for cough and shortness of breath.   Cardiovascular:  Positive for chest pain. Negative for palpitations.  Gastrointestinal:  Negative for abdominal pain, nausea and vomiting.  Endocrine: Negative for polydipsia and polyuria.  Genitourinary:  Negative for difficulty urinating and hematuria.  Musculoskeletal:  Negative for gait problem and joint swelling.  Skin:  Negative for pallor and rash.  Neurological:  Negative for syncope and headaches.  Psychiatric/Behavioral:  Negative for agitation and confusion.     Physical Exam Updated Vital Signs BP (!) 130/91   Pulse 73   Temp 98.3 F (36.8 C) (Oral)   Resp 10   Ht 5\' 8"  (1.727 m)   Wt 89.8 kg   SpO2 100%   BMI 30.11 kg/m  Physical Exam Vitals and nursing note reviewed.  Constitutional:      General: He is not in acute distress.    Appearance: Normal appearance. He is well-developed. He is not ill-appearing or diaphoretic.  HENT:     Head: Normocephalic and atraumatic.     Right Ear: External ear normal.     Left Ear: External ear normal.     Mouth/Throat:     Mouth: Mucous membranes are moist.  Eyes:     General: No scleral icterus. Cardiovascular:     Rate and Rhythm: Tachycardia present. Rhythm irregular.     Pulses: Normal pulses.     Heart sounds: Normal heart sounds.  Pulmonary:     Effort: Pulmonary effort is normal. No respiratory distress.     Breath sounds: Normal breath sounds.  Abdominal:     General: Abdomen is flat.     Palpations: Abdomen is soft.     Tenderness: There is no abdominal tenderness.  Musculoskeletal:        General: Normal range of motion.     Cervical back: Normal range of motion.     Right lower leg: No edema.     Left lower leg: No edema.  Skin:    General: Skin is warm and dry.     Capillary Refill: Capillary refill takes less than 2 seconds.  Neurological:     Mental Status: He is alert and oriented to person, place, and time.  Psychiatric:        Mood and Affect: Mood normal.        Behavior: Behavior normal.    ED Results / Procedures / Treatments   Labs (all labs ordered are listed, but only abnormal results are displayed) Labs Reviewed  BASIC METABOLIC PANEL - Abnormal; Notable for the following components:      Result Value   Glucose, Bld 112 (*)    BUN 24 (*)    All other components within normal limits  TROPONIN I (HIGH SENSITIVITY) - Abnormal; Notable for the following components:   Troponin I (High Sensitivity) 21 (*)    All other components within normal limits   TROPONIN I (HIGH SENSITIVITY) - Abnormal; Notable for the following components:   Troponin I (High Sensitivity) 20 (*)    All other components within normal limits  CBC  MAGNESIUM  TSH    EKG EKG Interpretation  Date/Time:  Tuesday Sep 22 2021 15:29:05 EDT Ventricular Rate:  77 PR Interval:  132 QRS Duration: 96 QT Interval:  354 QTC Calculation: 401 R Axis:   83 Text Interpretation: Sinus rhythm Borderline T abnormalities, inferior leads Confirmed by Wynona Dove ([id number]) on 09/22/2021 3:46:05 PM  Radiology DG Chest 2 View  Result Date: 09/22/2021 CLINICAL DATA:  Chest pain.  Atrial fibrillation. EXAM: CHEST - 2 VIEW COMPARISON:  Radiographs 03/19/2017. FINDINGS: The heart size and mediastinal contours are normal. The lungs are clear. There is no pleural effusion or pneumothorax. No acute osseous findings are identified. Previous left clavicular ORIF. IMPRESSION: Resolution of previously demonstrated left upper lobe airspace disease. No acute cardiopulmonary process. Electronically Signed   By: Richardean Sale M.D.   On: 09/22/2021 13:47    Procedures .Critical Care Performed by: Jeanell Sparrow, DO Authorized by: Jeanell Sparrow, DO   Critical care provider statement:    Critical care time (minutes):  31   Critical care time was exclusive of:  Separately billable procedures and treating other patients   Critical care was necessary to treat or prevent imminent or life-threatening deterioration of the following conditions:  Cardiac failure   Critical care was time spent personally by me on the following activities:  Development of treatment plan with patient or surrogate, discussions with consultants, evaluation of patient's response to treatment, examination of patient, ordering and review of laboratory studies, ordering and review of radiographic studies, ordering and performing treatments and interventions, pulse oximetry, re-evaluation of patient's condition, review of old charts  and obtaining history from patient or surrogate Comments:     Elev trop, afib    Medications Ordered in ED Medications - No data to display  ED Course/ Medical Decision Making/ A&P                           Medical Decision Making Amount and/or Complexity of Data Reviewed Labs: ordered. Radiology: ordered. ECG/medicine tests: ordered.    CC: Fatigue, irregular heart rate  This patient presents to the Emergency Department for the above complaint. This involves an extensive number of treatment options and is a complaint that carries with it a high risk of complications and morbidity. Vital signs were reviewed. Serious etiologies considered.  Differential includes all life-threatening causes for chest pain. This includes but is not exclusive to acute coronary syndrome, aortic dissection, pulmonary embolism, cardiac tamponade, community-acquired pneumonia, pericarditis, musculoskeletal chest wall pain, etc.   Record review:  Previous records obtained and reviewed  Prior office visits, prior labs and imaging  Additional history obtained from spouse  Medical and surgical history as noted above.   Work up as above, notable for:  Labs & imaging results that were available during my care of the patient were visualized by me and considered in my medical decision making.   I ordered imaging studies which included chest x-ray. I visualized the imaging, interpreted images, and I agree with radiologist interpretation.  No acute process   Cardiac monitoring reviewed and interpreted personally which shows monitor initially did show atrial fibrillation now has returned to normal sinus rhythm  ECG with atrial fibrillation.  Repeat ECG with normal sinus rhythm. Initial troponin is 21, he has no chest pain.   Favor demand ischemia 2/2 rapid HR.   Labs otherwise are unremarkable.  TSH is in process  Management: Patient did take aspirin just prior to arrival, no need to  re-dose  Reassessment:  Patient feels back to his baseline.  Symptoms have not reoccurred.  He remains in normal sinus rhythm.  No palpitations.  No chest pain.  Admission was considered.   CHA2DS2-VASc score is 0-1, he has history of HTN but has been taking off anti-hypertensive medications after diet/lifestyle changes.  Patient takes daily aspirin baby at nighttime.  Advised him to continue this.  We will give patient follow-up with cardiology for paroxysmal atrial fibrillation which has since resolved  Episode of paroxysmal atrial fibrillation may have been provoked by recent cruise for which he did drink increased amount of alcohol than normal and thinks he may have been dehydrated.  Defer further work-up to cardiology.  Patient remains at baseline.  Repeat ECG with sinus rhythm.  No further episodes of atrial fibrillation.  Patient signed out to incoming EDP pending delta troponin.  This is flat would favor discharge home with outpatient follow-up with A-fib clinic/cardiology.  Patient agreeable to this care plan.  He is hemodynamically stable     Social determinants of health include -  Social History   Socioeconomic History   Marital status: Legally Separated    Spouse name: Not on file   Number of children: Not on file   Years of education: Not on file   Highest education level: Not on file  Occupational History   Not on file  Tobacco Use   Smoking status: Never   Smokeless tobacco: Never  Vaping Use   Vaping Use: Never used  Substance and Sexual Activity   Alcohol use: Not Currently    Comment: 1-2 per month    Drug use: Never   Sexual activity: Yes    Birth control/protection: None  Other Topics Concern   Not on file  Social History Narrative   Not on file   Social Determinants of Health   Financial Resource Strain: Not on file  Food Insecurity: Not on file  Transportation Needs: Not on file  Physical Activity: Not on file  Stress: Not on file  Social  Connections: Not on file  Intimate Partner Violence: Not on file      This chart was dictated using voice recognition software.  Despite best efforts to proofread,  errors can occur which can change the documentation meaning.         Final Clinical Impression(s) / ED Diagnoses Final diagnoses:  Atrial fibrillation, unspecified type ([place])  Paroxysmal atrial fibrillation ([place])    Rx / DC Orders ED Discharge Orders          Ordered    Ambulatory referral to Cardiology       Comments: If you have not heard from the Cardiology office within the next 72 hours please call [phone].   09/22/21 1523    Amb Referral to AFIB Clinic        09/22/21 Zinc, [place], DO 09/22/21 1819

## 2021-09-22 NOTE — ED Triage Notes (Addendum)
Pt arrives pov, steady gait c/o chest pressure and reports watch alerting afib today x 3 days. Recent cruise, endorses increase in caffeine and etoh.

## 2021-09-23 ENCOUNTER — Telehealth: Payer: Self-pay | Admitting: General Practice

## 2021-09-23 NOTE — Telephone Encounter (Signed)
Transition Care Management Follow-up Telephone Call Date of discharge and from where: 09/22/21 from [facility] How have you been since you were released from the hospital? Patient is doing better. He has been in touch with the Afib clinic to get an appointment. He is also expecting a call today from a cardiologist office today and is hoping to get an appointment soon. He has been advised to call and make an appointment with Christen Butter, NP but at this time, he wants to follow up with cardiologist and Afib clinic.  Any questions or concerns? No  Items Reviewed: Did the pt receive and understand the discharge instructions provided? Yes  Medications obtained and verified? No  Other? No  Any new allergies since your discharge? No  Dietary orders reviewed? Yes Do you have support at home? Yes   Home Care and Equipment/Supplies: Were home health services ordered? No  Functional Questionnaire: (I = Independent and D = Dependent) ADLs: I  Bathing/Dressing- I  Meal Prep- I  Eating- I  Maintaining continence- I  Transferring/Ambulation- I  Managing Meds- I  Follow up appointments reviewed:  PCP Hospital f/u appt confirmed? No  He will call back to schedule. Specialist Hospital f/u appt confirmed? No  Patient has called the Afib clinic and is wanting to follow up with them at this time. Are transportation arrangements needed? No  If their condition worsens, is the pt aware to call PCP or go to the Emergency Dept.? Yes Was the patient provided with contact information for the PCP's office or ED? Yes Was to pt encouraged to call back with questions or concerns? Yes

## 2021-09-24 LAB — TSH: TSH: 1.729 u[IU]/mL (ref 0.350–4.500)

## 2021-09-25 ENCOUNTER — Encounter (HOSPITAL_COMMUNITY): Payer: Self-pay | Admitting: Physician Assistant

## 2021-09-25 ENCOUNTER — Other Ambulatory Visit (HOSPITAL_BASED_OUTPATIENT_CLINIC_OR_DEPARTMENT_OTHER): Payer: Self-pay

## 2021-09-25 ENCOUNTER — Ambulatory Visit (HOSPITAL_COMMUNITY)
Admission: RE | Admit: 2021-09-25 | Discharge: 2021-09-25 | Disposition: A | Discharge status: Home / Self Care | Payer: 59 | Source: Ambulatory Visit | Attending: Physician Assistant | Admitting: Physician Assistant

## 2021-09-25 VITALS — BP 122/84 | HR 76 | Ht 68.0 in | Wt 198.6 lb

## 2021-09-25 DIAGNOSIS — Z8249 Family history of ischemic heart disease and other diseases of the circulatory system: Secondary | ICD-10-CM | POA: Insufficient documentation

## 2021-09-25 DIAGNOSIS — I10 Essential (primary) hypertension: Secondary | ICD-10-CM | POA: Diagnosis not present

## 2021-09-25 DIAGNOSIS — E785 Hyperlipidemia, unspecified: Secondary | ICD-10-CM | POA: Insufficient documentation

## 2021-09-25 DIAGNOSIS — Z79899 Other long term (current) drug therapy: Secondary | ICD-10-CM | POA: Diagnosis not present

## 2021-09-25 DIAGNOSIS — I480 Paroxysmal atrial fibrillation: Secondary | ICD-10-CM | POA: Insufficient documentation

## 2021-09-25 DIAGNOSIS — Z7982 Long term (current) use of aspirin: Secondary | ICD-10-CM | POA: Insufficient documentation

## 2021-09-25 DIAGNOSIS — I4891 Unspecified atrial fibrillation: Secondary | ICD-10-CM | POA: Diagnosis not present

## 2021-09-25 MED ORDER — DILTIAZEM HCL 30 MG PO TABS
ORAL_TABLET | ORAL | 1 refills | Status: AC
  Filled 2021-09-25: qty 30, 30d supply, fill #0

## 2021-09-25 NOTE — Progress Notes (Signed)
Primary Care Physician: Samuel Bouche, NP Primary Cardiologist: none Primary Electrophysiologist: none Referring Physician: [facility]   Stephannie Li is a 46 y.o. male with a history of HTN, HLD, atrial fibrillation who presents for consultation in the [facility].  The patient was initially diagnosed with atrial fibrillation 09/22/21 after presenting to the ED with symptoms of extreme fatigue. His fitbit watch alerted that he was in afib for 3 days prior. He spontaneously converted to SR at the ED. Patient reports that he was on a cruise just before the onset of his symptoms. In hindsight, his Fitbit has alerted for afib multiple times in the past, as recently as one month ago. Patient has a CHADS2VASC score of 0.  Today, he denies symptoms of palpitations, chest pain, shortness of breath, orthopnea, PND, lower extremity edema, dizziness, presyncope, syncope, snoring, daytime somnolence, bleeding, or neurologic sequela. The patient is tolerating medications without difficulties and is otherwise without complaint today.    Atrial Fibrillation Risk Factors:  he does not have symptoms or diagnosis of sleep apnea. he does not have a history of rheumatic fever. he does have a history of alcohol use. The patient does have a history of early familial atrial fibrillation or other arrhythmias. Several family members on maternal side have afib.  he has a BMI of Body mass index is 30.2 kg/m.Marland Kitchen Filed Weights   09/25/21 0917  Weight: 90.1 kg    Family History  Problem Relation Age of Onset   Hypertension Mother    Hyperlipidemia Mother    Breast cancer Mother    Hypertension Father    Hyperlipidemia Father    Heart attack Maternal Grandfather    Heart attack Paternal Grandmother    Heart attack Paternal Grandfather      Atrial Fibrillation Management history:  Previous antiarrhythmic drugs: none Previous cardioversions: none Previous ablations:  none CHADS2VASC score: 0-1 Anticoagulation history: none   Past Medical History:  Diagnosis Date   Allergy    History of recurrent ear infection    Hypertension    Mixed hyperlipidemia 05/17/2018   Plantar warts    Pneumonia    Past Surgical History:  Procedure Laterality Date   CLAVICLE SURGERY     NASAL FRACTURE SURGERY     NOSE SURGERY     SHOULDER SURGERY      Current Outpatient Medications  Medication Sig Dispense Refill   aspirin EC 81 MG tablet Take 81 mg by mouth daily.     cetirizine (ZYRTEC) 10 MG tablet Take 10 mg by mouth daily.     CHONDROITIN SULFATE PO Take 1 tablet by mouth daily.     diltiazem (CARDIZEM) 30 MG tablet Take 1 tablet by mouth every 4 hours AS NEEDED for AFIB HR>100 30 tablet 1   fluticasone (FLONASE) 50 MCG/ACT nasal spray Place 1 spray into both nostrils daily.     Melatonin 5 MG CHEW Chew 1 tablet by mouth at bedtime.     Omega-3 Fatty Acids (FISH OIL PO) Take 1 capsule by mouth daily.     VITAMIN D PO Take 1 tablet by mouth daily.     No current facility-administered medications for this encounter.    No Known Allergies  Social History   Socioeconomic History   Marital status: Divorced    Spouse name: Not on file   Number of children: Not on file   Years of education: Not on file   Highest education level:  Not on file  Occupational History   Not on file  Tobacco Use   Smoking status: Never   Smokeless tobacco: Never   Tobacco comments:    Never smoke 09/25/21  Vaping Use   Vaping Use: Never used  Substance and Sexual Activity   Alcohol use: Yes    Alcohol/week: 1.0 - 2.0 standard drink    Types: 1 - 2 Shots of liquor per week    Comment: 1-2 per month 09/25/21   Drug use: Never   Sexual activity: Yes    Birth control/protection: None  Other Topics Concern   Not on file  Social History Narrative   Not on file   Social Determinants of Health   Financial Resource Strain: Not on file  Food Insecurity: Not on file   Transportation Needs: Not on file  Physical Activity: Not on file  Stress: Not on file  Social Connections: Not on file  Intimate Partner Violence: Not on file     ROS- All systems are reviewed and negative except as per the HPI above.  Physical Exam: Vitals:   09/25/21 0917  BP: 122/84  Pulse: 76  Weight: 90.1 kg  Height: 5\' 8"  (1.727 m)    GEN- The patient is a well appearing male, alert and oriented x 3 today.   Head- normocephalic, atraumatic Eyes-  Sclera clear, conjunctiva pink Ears- hearing intact Oropharynx- clear Neck- supple  Lungs- Clear to ausculation bilaterally, normal work of breathing Heart- Regular rate and rhythm, no murmurs, rubs or gallops  GI- soft, NT, ND, + BS Extremities- no clubbing, cyanosis, or edema MS- no significant deformity or atrophy Skin- no rash or lesion Psych- euthymic mood, full affect Neuro- strength and sensation are intact  Wt Readings from Last 3 Encounters:  09/25/21 90.1 kg  09/22/21 89.8 kg  06/21/19 84.8 kg    EKG today demonstrates  SR Vent. rate 76 BPM PR interval 126 ms QRS duration 100 ms QT/QTcB 350/393 ms   Epic records are reviewed at length today  CHA2DS2-VASc Score = 0  The patient's score is based upon: CHF History: 0 HTN History: 0 (controlled with lifestyle changes) Diabetes History: 0 Stroke History: 0 Vascular Disease History: 0 Age Score: 0 Gender Score: 0       ASSESSMENT AND PLAN: 1. Paroxysmal Atrial Fibrillation (ICD10:  I48.0) The patient's CHA2DS2-VASc score is 0, indicating a 0.2% annual risk of stroke.   General education about afib provided and questions answered. We also discussed his stroke risk and the risks and benefits of anticoagulation. Patient has had several episodes of afib noted on fitbit over the past few years, appears to be increasing in frequency.  We discussed rhythm control options including AAD and ablation. He is interested in consultation with EP for ablation,  will refer.  Check echocardiogram Start diltiazem 30 mg PRN q 4 hours for heart racing. Fitbit for home monitoring.     Follow up with EP for ablation consideration.    [facility] [address] [place], [place] [address] [phone] 09/25/2021 10:11 AM

## 2021-09-25 NOTE — Patient Instructions (Signed)
Cardizem 30mg  -- Take 1 tablet every 4 hours AS NEEDED for AFIB heart rate >100

## 2021-10-08 ENCOUNTER — Ambulatory Visit (HOSPITAL_COMMUNITY)
Admission: RE | Admit: 2021-10-08 | Discharge: 2021-10-08 | Disposition: A | Discharge status: Home / Self Care | Payer: 59 | Source: Ambulatory Visit | Attending: Physician Assistant | Admitting: Physician Assistant

## 2021-10-08 DIAGNOSIS — I480 Paroxysmal atrial fibrillation: Secondary | ICD-10-CM | POA: Diagnosis not present

## 2021-10-08 DIAGNOSIS — E785 Hyperlipidemia, unspecified: Secondary | ICD-10-CM | POA: Diagnosis not present

## 2021-10-08 DIAGNOSIS — I10 Essential (primary) hypertension: Secondary | ICD-10-CM | POA: Diagnosis not present

## 2021-10-08 LAB — ECHOCARDIOGRAM COMPLETE
AR max vel: 2.94 cm2
AV Area VTI: 2.51 cm2
AV Area mean vel: 2.7 cm2
AV Mean grad: 7 mmHg
AV Peak grad: 13.3 mmHg
Ao pk vel: 1.83 m/s
Area-P 1/2: 3.42 cm2
Calc EF: 59.2 %
S' Lateral: 4 cm
Single Plane A2C EF: 64.5 %
Single Plane A4C EF: 54 %

## 2021-10-09 ENCOUNTER — Encounter (HOSPITAL_COMMUNITY): Payer: Self-pay | Admitting: *Deleted

## 2021-10-27 NOTE — Progress Notes (Unsigned)
Electrophysiology Office Note:    Date:  10/28/2021   ID:  Albert Knight, DOB 03/02/1976, MRN [id number]  PCP:  Christen Butter, NP  [facility] Cardiologist:  None  [facility] Electrophysiologist:  Lanier Prude, MD   Referring MD: Albert Goltz, PA   Chief Complaint: Paroxysmal atrial fibrillation  History of Present Illness:    Albert Knight is a 46 y.o. male who presents for an evaluation of paroxysmal atrial fibrillation at the request of Albert Loa, PA-C. Their medical history includes hypertension, hyperlipidemia.  The patient is all Albert Knight 2, 2023 in the atrial fibrillation clinic.  The initial diagnosis of atrial fibrillation dates to Sep 22, 2021 when he presented to the emergency department with fatigue.  He had several days of atrial fibrillation at that point.  He was alerted to the possibility of atrial fibrillation by his Fitbit.  He told Albert at the appointment that he had been receiving alerts from his Fitbit for quite some time.  Catheter ablation and antiarrhythmics were discussed with the patient at that appointment.     Past Medical History:  Diagnosis Date   Allergy    History of recurrent ear infection    Hypertension    Mixed hyperlipidemia 05/17/2018   Plantar warts    Pneumonia     Past Surgical History:  Procedure Laterality Date   CLAVICLE SURGERY     NASAL FRACTURE SURGERY     NOSE SURGERY     SHOULDER SURGERY      Current Medications: Current Meds  Medication Sig   cetirizine (ZYRTEC) 10 MG tablet Take 10 mg by mouth daily.   CHONDROITIN SULFATE PO Take 1 tablet by mouth daily.   diltiazem (CARDIZEM) 30 MG tablet Take 1 tablet by mouth every 4 hours AS NEEDED for AFIB HR>100   fluticasone (FLONASE) 50 MCG/ACT nasal spray Place 1 spray into both nostrils daily.   Melatonin 5 MG CHEW Chew 1 tablet by mouth at bedtime.   Omega-3 Fatty Acids (FISH OIL PO) Take 1 capsule by mouth daily.   VITAMIN D PO Take 1 tablet by mouth daily.      Allergies:   Patient has no known allergies.   Social History   Socioeconomic History   Marital status: Divorced    Spouse name: Not on file   Number of children: Not on file   Years of education: Not on file   Highest education level: Not on file  Occupational History   Not on file  Tobacco Use   Smoking status: Never   Smokeless tobacco: Never   Tobacco comments:    Never smoke 09/25/21  Vaping Use   Vaping Use: Never used  Substance and Sexual Activity   Alcohol use: Yes    Alcohol/week: 1.0 - 2.0 standard drink of alcohol    Types: 1 - 2 Shots of liquor per week    Comment: 1-2 per month 09/25/21   Drug use: Never   Sexual activity: Yes    Birth control/protection: None  Other Topics Concern   Not on file  Social History Narrative   Not on file   Social Determinants of Health   Financial Resource Strain: Not on file  Food Insecurity: Not on file  Transportation Needs: Not on file  Physical Activity: Not on file  Stress: Not on file  Social Connections: Not on file     Family History: The patient's family history includes Breast cancer in his mother;  Heart attack in his maternal grandfather, paternal grandfather, and paternal grandmother; Hyperlipidemia in his father and mother; Hypertension in his father and mother.  ROS:   Please see the history of present illness.    All other systems reviewed and are negative.  EKGs/Labs/Other Studies Reviewed:    The following studies were reviewed today:  Knight 15, 2023 echo Left ventricular function low normal, 50% Right ventricular function normal Trivial MR  Sep 22, 2021 EKG shows atrial fibrillation       Recent Labs: 09/22/2021: BUN 24; Creatinine, Ser 1.23; Hemoglobin 16.1; Magnesium 2.2; Platelets 319; Potassium 4.1; Sodium 139; TSH 1.729  Recent Lipid Panel    Component Value Date/Time   CHOL 180 06/06/2020 0805   TRIG 41 06/06/2020 0805   HDL 67 06/06/2020 0805   CHOLHDL 2.7 06/06/2020 0805    VLDL 18 06/18/2015 0924   LDLCALC 101 (H) 06/06/2020 0805    Physical Exam:    VS:  BP (!) 142/68   Pulse 77   Ht 5\' 8"  (1.727 m)   Wt 196 lb 9.6 oz (89.2 kg)   SpO2 96%   BMI 29.89 kg/m     Wt Readings from Last 3 Encounters:  10/28/21 196 lb 9.6 oz (89.2 kg)  09/25/21 198 lb 9.6 oz (90.1 kg)  09/22/21 198 lb (89.8 kg)     GEN:  Well nourished, well developed in no acute distress HEENT: Normal NECK: No JVD; No carotid bruits LYMPHATICS: No lymphadenopathy CARDIAC: RRR, no murmurs, rubs, gallops RESPIRATORY:  Clear to auscultation without rales, wheezing or rhonchi  ABDOMEN: Soft, non-tender, non-distended MUSCULOSKELETAL:  No edema; No deformity  SKIN: Warm and dry NEUROLOGIC:  Alert and oriented x 3 PSYCHIATRIC:  Normal affect       ASSESSMENT:    1. Paroxysmal atrial fibrillation (HCC)   2. Atrial fibrillation, unspecified type (HCC)    PLAN:    In order of problems listed above:  #Paroxysmal AF Symptomatic. Episodes becoming more frequent.   Discussed treatment options today for his AF including antiarrhythmic drug therapy and ablation. Discussed risks, recovery and likelihood of success. Discussed potential need for repeat ablation procedures and antiarrhythmic drugs after an initial ablation. They wish to proceed with scheduling.  Risk, benefits, and alternatives to EP study and radiofrequency ablation for afib were also discussed in detail today. These risks include but are not limited to stroke, bleeding, vascular damage, tamponade, perforation, damage to the esophagus, lungs, and other structures, pulmonary vein stenosis, worsening renal function, and death. The patient understands these risk and wishes to proceed.  We will therefore proceed with catheter ablation at the next available time.  Carto, ICE, anesthesia are requested for the procedure.  Will also obtain CT PV protocol prior to the procedure to exclude LAA thrombus and further evaluate atrial  anatomy.  He will need to start eliquis 4 weeks prior to his ablation. This will continue for at least 3-6 months after the ablation.  Avoid excessive caffeine and alcohol intake as these appear to be triggers for his AF.      Total time spent with patient today 60 minutes. This includes reviewing records, evaluating the patient and coordinating care.  Medication Adjustments/Labs and Tests Ordered: Current medicines are reviewed at length with the patient today.  Concerns regarding medicines are outlined above.  Orders Placed This Encounter  Procedures   CT CARDIAC MORPH/PULM VEIN W/CM&W/O CA SCORE   Basic metabolic panel   CBC with Differential/Platelet   No  orders of the defined types were placed in this encounter.    Signed, Rossie Muskrat. Lalla Brothers, MD, [facility], [facility] 10/28/2021 6:40 PM    Electrophysiology [facility]

## 2021-10-28 ENCOUNTER — Encounter: Payer: Self-pay | Admitting: Cardiology

## 2021-10-28 ENCOUNTER — Ambulatory Visit (INDEPENDENT_AMBULATORY_CARE_PROVIDER_SITE_OTHER): Payer: 59 | Admitting: Cardiology

## 2021-10-28 VITALS — BP 142/68 | HR 77 | Ht 68.0 in | Wt 196.6 lb

## 2021-10-28 DIAGNOSIS — I4891 Unspecified atrial fibrillation: Secondary | ICD-10-CM

## 2021-10-28 DIAGNOSIS — I480 Paroxysmal atrial fibrillation: Secondary | ICD-10-CM | POA: Diagnosis not present

## 2021-10-28 NOTE — Patient Instructions (Addendum)
Medication Instructions:  Your physician recommends that you continue on your current medications as directed. Please refer to the Current Medication list given to you today.  *If you need a refill on your cardiac medications before your next appointment, please call your pharmacy*   Lab Work: None If you have labs (blood work) drawn today and your tests are completely normal, you will receive your results only by: MyChart Message (if you have MyChart) OR A paper copy in the mail If you have any lab test that is abnormal or we need to change your treatment, we will call you to review the results.   Follow-Up: At [facility], you and your health needs are our priority.  As part of our continuing mission to provide you with exceptional heart care, we have created designated Provider Care Teams.  These Care Teams include your primary Cardiologist (physician) and Advanced Practice Providers (APPs -  Physician Assistants and Nurse Practitioners) who all work together to provide you with the care you need, when you need it.   Your next appointment:   We will call you to schedule  Other Instructions See letter for instructions

## 2021-10-29 ENCOUNTER — Other Ambulatory Visit: Payer: Self-pay

## 2021-10-29 ENCOUNTER — Other Ambulatory Visit (HOSPITAL_BASED_OUTPATIENT_CLINIC_OR_DEPARTMENT_OTHER): Payer: Self-pay

## 2021-10-29 MED ORDER — APIXABAN 5 MG PO TABS
5.0000 mg | ORAL_TABLET | Freq: Two times a day (BID) | ORAL | 1 refills | Status: AC
  Filled 2021-10-29: qty 60, 30d supply, fill #0
  Filled 2022-01-04: qty 60, 30d supply, fill #1
  Filled 2022-01-22 – 2022-01-27 (×2): qty 60, 30d supply, fill #2
  Filled 2022-01-29: qty 60, 30d supply, fill #0
  Filled 2022-01-29: qty 60, 30d supply, fill #2

## 2021-10-29 MED ORDER — METOPROLOL TARTRATE 100 MG PO TABS
100.0000 mg | ORAL_TABLET | ORAL | 0 refills | Status: AC
  Filled 2021-10-29: qty 1, 1d supply, fill #0

## 2021-11-16 ENCOUNTER — Other Ambulatory Visit (HOSPITAL_BASED_OUTPATIENT_CLINIC_OR_DEPARTMENT_OTHER): Payer: Self-pay

## 2021-11-16 MED ORDER — DOXYCYCLINE HYCLATE 100 MG PO CAPS
ORAL_CAPSULE | ORAL | 0 refills | Status: AC
  Filled 2021-11-16: qty 2, 1d supply, fill #0

## 2021-11-20 ENCOUNTER — Encounter: Payer: Self-pay | Admitting: *Deleted

## 2021-11-20 NOTE — Telephone Encounter (Signed)
Prescription is ready. Patient requests it to be mailed to him.  Placed to be mailed today.

## 2021-12-18 ENCOUNTER — Other Ambulatory Visit: Payer: 59

## 2021-12-18 DIAGNOSIS — I480 Paroxysmal atrial fibrillation: Secondary | ICD-10-CM

## 2021-12-19 LAB — CBC WITH DIFFERENTIAL/PLATELET
Basophils Absolute: 0 10*3/uL (ref 0.0–0.2)
Basos: 1 %
EOS (ABSOLUTE): 0.1 10*3/uL (ref 0.0–0.4)
Eos: 1 %
Hematocrit: 51.6 % — ABNORMAL HIGH (ref 37.5–51.0)
Hemoglobin: 17.6 g/dL (ref 13.0–17.7)
Immature Grans (Abs): 0 10*3/uL (ref 0.0–0.1)
Immature Granulocytes: 0 %
Lymphocytes Absolute: 1.3 10*3/uL (ref 0.7–3.1)
Lymphs: 21 %
MCH: 31.7 pg (ref 26.6–33.0)
MCHC: 34.1 g/dL (ref 31.5–35.7)
MCV: 93 fL (ref 79–97)
Monocytes Absolute: 0.6 10*3/uL (ref 0.1–0.9)
Monocytes: 9 %
Neutrophils Absolute: 4.1 10*3/uL (ref 1.4–7.0)
Neutrophils: 68 %
Platelets: 292 10*3/uL (ref 150–450)
RBC: 5.55 x10E6/uL (ref 4.14–5.80)
RDW: 12.5 % (ref 11.6–15.4)
WBC: 6.1 10*3/uL (ref 3.4–10.8)

## 2021-12-19 LAB — BASIC METABOLIC PANEL
BUN/Creatinine Ratio: 22 — ABNORMAL HIGH (ref 9–20)
BUN: 24 mg/dL (ref 6–24)
CO2: 25 mmol/L (ref 20–29)
Calcium: 10 mg/dL (ref 8.7–10.2)
Chloride: 98 mmol/L (ref 96–106)
Creatinine, Ser: 1.09 mg/dL (ref 0.76–1.27)
Glucose: 105 mg/dL — ABNORMAL HIGH (ref 70–99)
Potassium: 4.6 mmol/L (ref 3.5–5.2)
Sodium: 139 mmol/L (ref 134–144)
eGFR: 85 mL/min/{1.73_m2} (ref >59)

## 2021-12-31 ENCOUNTER — Telehealth (HOSPITAL_COMMUNITY): Payer: Self-pay | Admitting: *Deleted

## 2021-12-31 NOTE — Telephone Encounter (Signed)
Reaching out to patient to offer assistance regarding upcoming cardiac imaging study; pt verbalizes understanding of appt date/time, parking situation and where to check in, pre-test NPO status and medications ordered, and verified current allergies; name and call back number provided for further questions should they arise ° °Finnis Colee RN Navigator Cardiac Imaging °[place] [facility] °[phone] office °[phone] cell ° °Patient to take 100mg metoprolol tartrate two hours prior to his cardiac CT scan. °

## 2021-12-31 NOTE — Telephone Encounter (Signed)
Reaching out to patient to offer assistance regarding upcoming cardiac imaging study. Patient in the middle of a work meeting and will call back shortly after.  Larey Brick RN Navigator Cardiac Imaging [facility] [phone] office [phone] cell

## 2022-01-01 ENCOUNTER — Telehealth: Payer: Self-pay | Admitting: *Deleted

## 2022-01-01 ENCOUNTER — Encounter (HOSPITAL_BASED_OUTPATIENT_CLINIC_OR_DEPARTMENT_OTHER): Payer: Self-pay

## 2022-01-01 ENCOUNTER — Ambulatory Visit (HOSPITAL_BASED_OUTPATIENT_CLINIC_OR_DEPARTMENT_OTHER)
Admission: RE | Admit: 2022-01-01 | Discharge: 2022-01-01 | Disposition: A | Discharge status: Home / Self Care | Payer: 59 | Source: Ambulatory Visit | Attending: Cardiology | Admitting: Cardiology

## 2022-01-01 DIAGNOSIS — I4891 Unspecified atrial fibrillation: Secondary | ICD-10-CM | POA: Diagnosis present

## 2022-01-01 MED ORDER — IOHEXOL 350 MG/ML SOLN
100.0000 mL | Freq: Once | INTRAVENOUS | Status: AC | PRN
  Administered 2022-01-01: 80 mL via INTRAVENOUS

## 2022-01-01 NOTE — Telephone Encounter (Signed)
Notified new procedure time with the patient and verbalized understanding. 9/15 8:30 am

## 2022-01-04 ENCOUNTER — Other Ambulatory Visit (HOSPITAL_BASED_OUTPATIENT_CLINIC_OR_DEPARTMENT_OTHER): Payer: Self-pay

## 2022-01-07 NOTE — Pre-Procedure Instructions (Signed)
Instructed patient on the following items: Arrival time 0830 Nothing to eat or drink after midnight No meds AM of procedure Responsible person to drive you home and stay with you for 24 hrs  Have you missed any doses of anti-coagulant Eliquis- hasn't missed any doses   

## 2022-01-08 ENCOUNTER — Encounter (HOSPITAL_COMMUNITY)
Admission: RE | Disposition: A | Discharge status: Home / Self Care | Payer: 59 | Source: Ambulatory Visit | Attending: Cardiology

## 2022-01-08 ENCOUNTER — Ambulatory Visit (HOSPITAL_BASED_OUTPATIENT_CLINIC_OR_DEPARTMENT_OTHER): Payer: 59 | Admitting: Anesthesiology

## 2022-01-08 ENCOUNTER — Ambulatory Visit (HOSPITAL_COMMUNITY)
Admission: RE | Admit: 2022-01-08 | Discharge: 2022-01-08 | Disposition: A | Discharge status: Home / Self Care | Payer: 59 | Source: Ambulatory Visit | Attending: Cardiology | Admitting: Cardiology

## 2022-01-08 ENCOUNTER — Ambulatory Visit (HOSPITAL_COMMUNITY): Payer: 59 | Admitting: Anesthesiology

## 2022-01-08 ENCOUNTER — Other Ambulatory Visit: Payer: Self-pay

## 2022-01-08 ENCOUNTER — Other Ambulatory Visit (HOSPITAL_COMMUNITY): Payer: Self-pay

## 2022-01-08 ENCOUNTER — Encounter (HOSPITAL_COMMUNITY): Payer: Self-pay | Admitting: Cardiology

## 2022-01-08 DIAGNOSIS — I10 Essential (primary) hypertension: Secondary | ICD-10-CM

## 2022-01-08 DIAGNOSIS — D759 Disease of blood and blood-forming organs, unspecified: Secondary | ICD-10-CM

## 2022-01-08 DIAGNOSIS — E785 Hyperlipidemia, unspecified: Secondary | ICD-10-CM | POA: Insufficient documentation

## 2022-01-08 DIAGNOSIS — I4891 Unspecified atrial fibrillation: Secondary | ICD-10-CM

## 2022-01-08 DIAGNOSIS — I480 Paroxysmal atrial fibrillation: Secondary | ICD-10-CM | POA: Insufficient documentation

## 2022-01-08 HISTORY — PX: ATRIAL FIBRILLATION ABLATION: EP1191

## 2022-01-08 LAB — POCT ACTIVATED CLOTTING TIME
Activated Clotting Time: 323 seconds
Activated Clotting Time: 329 seconds

## 2022-01-08 SURGERY — ATRIAL FIBRILLATION ABLATION
Anesthesia: General

## 2022-01-08 MED ORDER — SODIUM CHLORIDE 0.9 % IV SOLN: INTRAVENOUS | Status: DC

## 2022-01-08 MED ORDER — HEPARIN (PORCINE) IN NACL 1000-0.9 UT/500ML-% IV SOLN
INTRAVENOUS | Status: DC | PRN
  Administered 2022-01-08 (×3): 500 mL

## 2022-01-08 MED ORDER — SODIUM CHLORIDE 0.9% FLUSH: 3.0000 mL | Freq: Two times a day (BID) | INTRAVENOUS | Status: DC

## 2022-01-08 MED ORDER — ACETAMINOPHEN 500 MG PO TABS
1000.0000 mg | ORAL_TABLET | Freq: Once | ORAL | Status: AC
  Administered 2022-01-08: 1000 mg via ORAL

## 2022-01-08 MED ORDER — PROTAMINE SULFATE 10 MG/ML IV SOLN
INTRAVENOUS | Status: DC | PRN
  Administered 2022-01-08 (×3): 10 mg via INTRAVENOUS

## 2022-01-08 MED ORDER — APIXABAN 5 MG PO TABS
5.0000 mg | ORAL_TABLET | Freq: Two times a day (BID) | ORAL | Status: DC
  Administered 2022-01-08: 5 mg via ORAL
  Filled 2022-01-08: qty 1

## 2022-01-08 MED ORDER — PANTOPRAZOLE SODIUM 40 MG PO TBEC
40.0000 mg | DELAYED_RELEASE_TABLET | Freq: Every day | ORAL | Status: DC
  Administered 2022-01-08: 40 mg via ORAL
  Filled 2022-01-08: qty 1

## 2022-01-08 MED ORDER — PHENYLEPHRINE HCL-NACL 20-0.9 MG/250ML-% IV SOLN
INTRAVENOUS | Status: DC | PRN
  Administered 2022-01-08: 20 ug/min via INTRAVENOUS

## 2022-01-08 MED ORDER — HEPARIN (PORCINE) IN NACL 1000-0.9 UT/500ML-% IV SOLN
INTRAVENOUS | Status: AC
  Filled 2022-01-08: qty 1500

## 2022-01-08 MED ORDER — MIDAZOLAM HCL 5 MG/5ML IJ SOLN
INTRAMUSCULAR | Status: DC | PRN
  Administered 2022-01-08: 2 mg via INTRAVENOUS

## 2022-01-08 MED ORDER — PROPOFOL 10 MG/ML IV BOLUS
INTRAVENOUS | Status: DC | PRN
  Administered 2022-01-08: 140 mg via INTRAVENOUS

## 2022-01-08 MED ORDER — LIDOCAINE 2% (20 MG/ML) 5 ML SYRINGE
INTRAMUSCULAR | Status: DC | PRN
  Administered 2022-01-08: 80 mg via INTRAVENOUS

## 2022-01-08 MED ORDER — ACETAMINOPHEN 500 MG PO TABS
ORAL_TABLET | ORAL | Status: AC
  Filled 2022-01-08: qty 2

## 2022-01-08 MED ORDER — HEPARIN SODIUM (PORCINE) 1000 UNIT/ML IJ SOLN
INTRAMUSCULAR | Status: AC
  Filled 2022-01-08: qty 10

## 2022-01-08 MED ORDER — SUGAMMADEX SODIUM 200 MG/2ML IV SOLN
INTRAVENOUS | Status: DC | PRN
  Administered 2022-01-08: 200 mg via INTRAVENOUS

## 2022-01-08 MED ORDER — COLCHICINE 0.6 MG PO TABS
0.6000 mg | ORAL_TABLET | Freq: Two times a day (BID) | ORAL | 0 refills | Status: AC
  Filled 2022-01-08: qty 10, 5d supply, fill #0

## 2022-01-08 MED ORDER — ONDANSETRON HCL 4 MG/2ML IJ SOLN
INTRAMUSCULAR | Status: DC | PRN
  Administered 2022-01-08: 4 mg via INTRAVENOUS

## 2022-01-08 MED ORDER — SODIUM CHLORIDE 0.9 % IV SOLN: 250.0000 mL | INTRAVENOUS | Status: DC | PRN

## 2022-01-08 MED ORDER — SODIUM CHLORIDE 0.9% FLUSH: 3.0000 mL | INTRAVENOUS | Status: DC | PRN

## 2022-01-08 MED ORDER — ISOPROTERENOL HCL 0.2 MG/ML IJ SOLN
INTRAVENOUS | Status: DC | PRN
  Administered 2022-01-08: 2 ug/min via INTRAVENOUS

## 2022-01-08 MED ORDER — DEXAMETHASONE SODIUM PHOSPHATE 10 MG/ML IJ SOLN
INTRAMUSCULAR | Status: DC | PRN
  Administered 2022-01-08: 10 mg via INTRAVENOUS

## 2022-01-08 MED ORDER — ACETAMINOPHEN 325 MG PO TABS
ORAL_TABLET | ORAL | Status: AC
  Filled 2022-01-08: qty 2

## 2022-01-08 MED ORDER — HEPARIN SODIUM (PORCINE) 1000 UNIT/ML IJ SOLN
INTRAMUSCULAR | Status: DC | PRN
  Administered 2022-01-08: 1000 [IU] via INTRAVENOUS

## 2022-01-08 MED ORDER — FENTANYL CITRATE (PF) 250 MCG/5ML IJ SOLN
INTRAMUSCULAR | Status: DC | PRN
  Administered 2022-01-08: 50 ug via INTRAVENOUS

## 2022-01-08 MED ORDER — ONDANSETRON HCL 4 MG/2ML IJ SOLN
4.0000 mg | Freq: Four times a day (QID) | INTRAMUSCULAR | Status: DC | PRN
  Filled 2022-01-08: qty 2

## 2022-01-08 MED ORDER — ROCURONIUM BROMIDE 10 MG/ML (PF) SYRINGE
PREFILLED_SYRINGE | INTRAVENOUS | Status: DC | PRN
  Administered 2022-01-08: 60 mg via INTRAVENOUS
  Administered 2022-01-08: 10 mg via INTRAVENOUS
  Administered 2022-01-08: 30 mg via INTRAVENOUS

## 2022-01-08 MED ORDER — ISOPROTERENOL HCL 0.2 MG/ML IJ SOLN
INTRAMUSCULAR | Status: AC
  Filled 2022-01-08: qty 5

## 2022-01-08 MED ORDER — COLCHICINE 0.6 MG PO TABS
0.6000 mg | ORAL_TABLET | Freq: Two times a day (BID) | ORAL | Status: DC
  Administered 2022-01-08: 0.6 mg via ORAL
  Filled 2022-01-08: qty 1

## 2022-01-08 MED ORDER — HEPARIN SODIUM (PORCINE) 1000 UNIT/ML IJ SOLN
INTRAMUSCULAR | Status: DC | PRN
  Administered 2022-01-08: 14000 [IU] via INTRAVENOUS
  Administered 2022-01-08 (×2): 3000 [IU] via INTRAVENOUS

## 2022-01-08 MED ORDER — ACETAMINOPHEN 325 MG PO TABS
650.0000 mg | ORAL_TABLET | ORAL | Status: DC | PRN
  Administered 2022-01-08: 650 mg via ORAL

## 2022-01-08 MED ORDER — PANTOPRAZOLE SODIUM 40 MG PO TBEC
40.0000 mg | DELAYED_RELEASE_TABLET | Freq: Every day | ORAL | 0 refills | Status: AC
  Filled 2022-01-08: qty 30, 30d supply, fill #0

## 2022-01-08 SURGICAL SUPPLY — 18 items
CATH 8FR REPROCESSED SOUNDSTAR (CATHETERS) ×1 IMPLANT
CATH 8FR SOUNDSTAR REPROCESSED (CATHETERS) IMPLANT
CATH ABLAT QDOT MICRO BI TC DF (CATHETERS) IMPLANT
CATH MAPPNG PENTARAY F 2-6-2MM (CATHETERS) IMPLANT
CATH S-M CIRCA TEMP PROBE (CATHETERS) IMPLANT
CATH WEB BI DIR CSDF CRV REPRO (CATHETERS) IMPLANT
CLOSURE PERCLOSE PROSTYLE (VASCULAR PRODUCTS) IMPLANT
COVER SWIFTLINK CONNECTOR (BAG) ×1 IMPLANT
PACK EP LATEX FREE (CUSTOM PROCEDURE TRAY) ×1
PACK EP LF (CUSTOM PROCEDURE TRAY) ×1 IMPLANT
PAD DEFIB RADIO PHYSIO CONN (PAD) ×1 IMPLANT
PENTARAY F 2-6-2MM (CATHETERS) ×1
SHEATH BAYLIS TRANSSEPTAL 98CM (NEEDLE) IMPLANT
SHEATH CARTO VIZIGO SM CVD (SHEATH) IMPLANT
SHEATH PINNACLE 8F 10CM (SHEATH) IMPLANT
SHEATH PINNACLE 9F 10CM (SHEATH) IMPLANT
SHEATH PROBE COVER 6X72 (BAG) IMPLANT
TUBING SMART ABLATE COOLFLOW (TUBING) IMPLANT

## 2022-01-08 NOTE — H&P (Signed)
Electrophysiology Office Note:     Date:  01/08/2022    ID:  Albert Knight, DOB 01/21/1976, MRN [id number]   PCP:  Albert Butter, NP        [facility] Cardiologist:  None  [facility] Electrophysiologist:  Lanier Prude, MD    Referring MD: Danice Goltz, PA    Chief Complaint: Paroxysmal atrial fibrillation   History of Present Illness:     Albert Knight is a 46 y.o. male who presents for an evaluation of paroxysmal atrial fibrillation at the request of Jorja Loa, PA-C. Their medical history includes hypertension, hyperlipidemia.  The patient is all Albert Knight 2, 2023 in the atrial fibrillation clinic.  The initial diagnosis of atrial fibrillation dates to Sep 22, 2021 when he presented to the emergency department with fatigue.  He had several days of atrial fibrillation at that point.  He was alerted to the possibility of atrial fibrillation by his Fitbit.  He told Albert at the appointment that he had been receiving alerts from his Fitbit for quite some time.  Catheter ablation and antiarrhythmics were discussed with the patient at that appointment.   Presents for PVI today. Procedure reviewed.     Objective      Past Medical History:  Diagnosis Date   Allergy     History of recurrent ear infection     Hypertension     Mixed hyperlipidemia 05/17/2018   Plantar warts     Pneumonia             Past Surgical History:  Procedure Laterality Date   CLAVICLE SURGERY       NASAL FRACTURE SURGERY       NOSE SURGERY       SHOULDER SURGERY          Current Medications: Active Medications      Current Meds  Medication Sig   cetirizine (ZYRTEC) 10 MG tablet Take 10 mg by mouth daily.   CHONDROITIN SULFATE PO Take 1 tablet by mouth daily.   diltiazem (CARDIZEM) 30 MG tablet Take 1 tablet by mouth every 4 hours AS NEEDED for AFIB HR>100   fluticasone (FLONASE) 50 MCG/ACT nasal spray Place 1 spray into both nostrils daily.   Melatonin 5 MG CHEW Chew 1 tablet by mouth  at bedtime.   Omega-3 Fatty Acids (FISH OIL PO) Take 1 capsule by mouth daily.   VITAMIN D PO Take 1 tablet by mouth daily.        Allergies:   Patient has no known allergies.    Social History         Socioeconomic History   Marital status: Divorced      Spouse name: Not on file   Number of children: Not on file   Years of education: Not on file   Highest education level: Not on file  Occupational History   Not on file  Tobacco Use   Smoking status: Never   Smokeless tobacco: Never   Tobacco comments:      Never smoke 09/25/21  Vaping Use   Vaping Use: Never used  Substance and Sexual Activity   Alcohol use: Yes      Alcohol/week: 1.0 - 2.0 standard drink of alcohol      Types: 1 - 2 Shots of liquor per week      Comment: 1-2 per month 09/25/21   Drug use: Never   Sexual activity: Yes      Birth control/protection:  None  Other Topics Concern   Not on file  Social History Narrative   Not on file    Social Determinants of Health    Financial Resource Strain: Not on file  Food Insecurity: Not on file  Transportation Needs: Not on file  Physical Activity: Not on file  Stress: Not on file  Social Connections: Not on file      Family History: The patient's family history includes Breast cancer in his mother; Heart attack in his maternal grandfather, paternal grandfather, and paternal grandmother; Hyperlipidemia in his father and mother; Hypertension in his father and mother.   ROS:   Please see the history of present illness.    All other systems reviewed and are negative.   EKGs/Labs/Other Studies Reviewed:     The following studies were reviewed today:   Knight 15, 2023 echo Left ventricular function low normal, 50% Right ventricular function normal Trivial MR   Sep 22, 2021 EKG shows atrial fibrillation             Recent Labs: 09/22/2021: BUN 24; Creatinine, Ser 1.23; Hemoglobin 16.1; Magnesium 2.2; Platelets 319; Potassium 4.1; Sodium 139; TSH  1.729  Recent Lipid Panel Labs (Brief)          Component Value Date/Time    CHOL 180 06/06/2020 0805    TRIG 41 06/06/2020 0805    HDL 67 06/06/2020 0805    CHOLHDL 2.7 06/06/2020 0805    VLDL 18 06/18/2015 0924    LDLCALC 101 (H) 06/06/2020 0805        Physical Exam:     VS:  BP (!) 120/72   Pulse 57   Ht 5\' 8"  (1.727 m)   Wt 196 lb 9.6 oz (89.2 kg)   SpO2 96%   BMI 29.89 kg/m         Wt Readings from Last 3 Encounters:  10/28/21 196 lb 9.6 oz (89.2 kg)  09/25/21 198 lb 9.6 oz (90.1 kg)  09/22/21 198 lb (89.8 kg)      GEN:  Well nourished, well developed in no acute distress HEENT: Normal NECK: No JVD; No carotid bruits LYMPHATICS: No lymphadenopathy CARDIAC: RRR, no murmurs, rubs, gallops RESPIRATORY:  Clear to auscultation without rales, wheezing or rhonchi  ABDOMEN: Soft, non-tender, non-distended MUSCULOSKELETAL:  No edema; No deformity  SKIN: Warm and dry NEUROLOGIC:  Alert and oriented x 3 PSYCHIATRIC:  Normal affect          Assessment ASSESSMENT:     1. Paroxysmal atrial fibrillation (HCC)   2. Atrial fibrillation, unspecified type (HCC)     PLAN:     In order of problems listed above:   #Paroxysmal AF Symptomatic. Episodes becoming more frequent.    Discussed treatment options today for his AF including antiarrhythmic drug therapy and ablation. Discussed risks, recovery and likelihood of success. Discussed potential need for repeat ablation procedures and antiarrhythmic drugs after an initial ablation. They wish to proceed with scheduling.   Risk, benefits, and alternatives to EP study and radiofrequency ablation for afib were also discussed in detail today. These risks include but are not limited to stroke, bleeding, vascular damage, tamponade, perforation, damage to the esophagus, lungs, and other structures, pulmonary vein stenosis, worsening renal function, and death. The patient understands these risk and wishes to proceed.  We will  therefore proceed with catheter ablation at the next available time.  Carto, ICE, anesthesia are requested for the procedure.  Will also obtain CT PV protocol prior to  the procedure to exclude LAA thrombus and further evaluate atrial anatomy.   He will need to start eliquis 4 weeks prior to his ablation. This will continue for at least 3-6 months after the ablation.   Avoid excessive caffeine and alcohol intake as these appear to be triggers for his AF.     Presents for PVI today.       Signed, Rossie Muskrat. Lalla Brothers, MD, [facility], [facility] 01/08/2022 Electrophysiology [facility]

## 2022-01-08 NOTE — Discharge Instructions (Addendum)
Cardiac Ablation, Care After  This sheet gives you information about how to care for yourself after your procedure. Your health care provider may also give you more specific instructions. If you have problems or questions, contact your health care provider. What can I expect after the procedure? After the procedure, it is common to have: Bruising around your puncture site. Tenderness around your puncture site. Skipped heartbeats. Tiredness (fatigue).  Follow these instructions at home: Puncture site care  Follow instructions from your health care provider about how to take care of your puncture site. Make sure you: If present, leave stitches (sutures), skin glue, or adhesive strips in place. These skin closures may need to stay in place for up to 2 weeks. If adhesive strip edges start to loosen and curl up, you may trim the loose edges. Do not remove adhesive strips completely unless your health care provider tells you to do that. If a large square bandage is present, this may be removed 24 hours after surgery.  Check your puncture site every day for signs of infection. Check for: Redness, swelling, or pain. Fluid or blood. If your puncture site starts to bleed, lie down on your back, apply firm pressure to the area, and contact your health care provider. Warmth. Pus or a bad smell. A pea or small marble sized lump at the site is normal and can take up to three months to resolve.  Driving Do not drive for at least 4 days after your procedure or however long your health care provider recommends. (Do not resume driving if you have previously been instructed not to drive for other health reasons.) Do not drive or use heavy machinery while taking prescription pain medicine. Activity Avoid activities that take a lot of effort for at least 7 days after your procedure. Do not lift anything that is heavier than 5 lb (4.5 kg) for one week.  No sexual activity for 1 week.  Return to your normal  activities as told by your health care provider. Ask your health care provider what activities are safe for you. General instructions Take over-the-counter and prescription medicines only as told by your health care provider. Do not use any products that contain nicotine or tobacco, such as cigarettes and e-cigarettes. If you need help quitting, ask your health care provider. You may shower after 24 hours, but Do not take baths, swim, or use a hot tub for 1 week.  Do not drink alcohol for 24 hours after your procedure. Keep all follow-up visits as told by your health care provider. This is important. Contact a health care provider if: You have redness, mild swelling, or pain around your puncture site. You have fluid or blood coming from your puncture site that stops after applying firm pressure to the area. Your puncture site feels warm to the touch. You have pus or a bad smell coming from your puncture site. You have a fever. You have chest pain or discomfort that spreads to your neck, jaw, or arm. You are sweating a lot. You feel nauseous. You have a fast or irregular heartbeat. You have shortness of breath. You are dizzy or light-headed and feel the need to lie down. You have pain or numbness in the arm or leg closest to your puncture site. Get help right away if: Your puncture site suddenly swells. Your puncture site is bleeding and the bleeding does not stop after applying firm pressure to the area. These symptoms may represent a serious problem that is  an emergency. Do not wait to see if the symptoms will go away. Get medical help right away. Call your local emergency services (911 in the [place] Do not drive yourself to the hospital. Summary After the procedure, it is normal to have bruising and tenderness at the puncture site in your groin, neck, or forearm. Check your puncture site every day for signs of infection. Get help right away if your puncture site is bleeding and the  bleeding does not stop after applying firm pressure to the area. This is a medical emergency. This information is not intended to replace advice given to you by your health care provider. Make sure you discuss any questions you have with your health care provider.      Post procedure care instructions No driving for 4 days. No lifting over 5 lbs for 1 week. No vigorous or sexual activity for 1 week. You may return to work/your usual activities on 01/16/22. Keep procedure site clean & dry. If you notice increased pain, swelling, bleeding or pus, call/return!  You may shower after 24 hours, but no soaking in baths/hot tubs/pools for 1 week.    You have an appointment set up with the Atrial Fibrillation Clinic.  Multiple studies have shown that being followed by a dedicated atrial fibrillation clinic in addition to the standard care you receive from your other physicians improves health. We believe that enrollment in the atrial fibrillation clinic will allow [place] to better care for you.   The phone number to the Atrial Fibrillation Clinic is [phone]. The clinic is staffed Monday through Friday from 8:30am to 5pm.  Parking Directions: The clinic is located in the Heart and Vascular Building connected to [facility]. 1)From [address] turn on to [address] and go to the 3rd entrance  (Heart and Vascular entrance) on the right. 2)Look to the right for Heart &Vascular Parking Garage. 3)A code for the entrance is required, for October is 1504.   4)Take the elevators to the 1st floor. Registration is in the room with the glass walls at the end of the hallway.  If you have any trouble parking or locating the clinic, please don't hesitate to call [phone].

## 2022-01-08 NOTE — Anesthesia Preprocedure Evaluation (Addendum)
Anesthesia Evaluation  Patient identified by MRN, date of birth, ID band Patient awake    Reviewed: Allergy & Precautions, NPO status , Patient's Chart, lab work & pertinent test results  Airway Mallampati: II  TM Distance: >3 FB Neck ROM: Full    Dental no notable dental hx. (+) Teeth Intact, Dental Advisory Given   Pulmonary neg pulmonary ROS,    Pulmonary exam normal        Cardiovascular hypertension, Pt. on home beta blockers + dysrhythmias Atrial Fibrillation  Rhythm:Irregular Rate:Normal     Neuro/Psych negative neurological ROS  negative psych ROS   GI/Hepatic negative GI ROS, Neg liver ROS,   Endo/Other  negative endocrine ROS  Renal/GU negative Renal ROS     Musculoskeletal negative musculoskeletal ROS (+)   Abdominal   Peds  Hematology  (+) Blood dyscrasia, ,   Anesthesia Other Findings A-fib  Reproductive/Obstetrics                           Anesthesia Physical Anesthesia Plan  ASA: 3  Anesthesia Plan: General   Post-op Pain Management:    Induction: Intravenous  PONV Risk Score and Plan: 2 and Ondansetron, Dexamethasone, Midazolam and Treatment may vary due to age or medical condition  Airway Management Planned: Oral ETT  Additional Equipment:   Intra-op Plan:   Post-operative Plan: Extubation in OR  Informed Consent: I have reviewed the patients History and Physical, chart, labs and discussed the procedure including the risks, benefits and alternatives for the proposed anesthesia with the patient or authorized representative who has indicated his/her understanding and acceptance.     Dental advisory given  Plan Discussed with: CRNA  Anesthesia Plan Comments:        Anesthesia Quick Evaluation

## 2022-01-08 NOTE — Transfer of Care (Signed)
Immediate Anesthesia Transfer of Care Note  Patient: Albert Knight  Procedure(s) Performed: ATRIAL FIBRILLATION ABLATION  Patient Location: Cath Lab  Anesthesia Type:General  Level of Consciousness: awake, oriented and patient cooperative  Airway & Oxygen Therapy: Patient Spontanous Breathing and Patient connected to nasal cannula oxygen  Post-op Assessment: Report given to RN and Post -op Vital signs reviewed and stable  Post vital signs: Reviewed  Last Vitals:  Vitals Value Taken Time  BP 130/70 01/08/22 1305  Temp 37.1 C 01/08/22 1245  Pulse 66 01/08/22 1305  Resp 13 01/08/22 1305  SpO2 96 % 01/08/22 1305    Last Pain:  Vitals:   01/08/22 1233  TempSrc:   PainSc: 0-No pain         Complications: There were no known notable events for this encounter.

## 2022-01-08 NOTE — Anesthesia Procedure Notes (Signed)
Procedure Name: Intubation Date/Time: 01/08/2022 10:44 AM  Performed by: Lovie Chol, CRNAPre-anesthesia Checklist: Patient identified, Emergency Drugs available, Suction available and Patient being monitored Patient Re-evaluated:Patient Re-evaluated prior to induction Oxygen Delivery Method: Circle System Utilized Preoxygenation: Pre-oxygenation with 100% oxygen Induction Type: IV induction Ventilation: Mask ventilation without difficulty Laryngoscope Size: Miller and 3 Grade View: Grade I Tube type: Oral Tube size: 7.5 mm Number of attempts: 1 Airway Equipment and Method: Stylet and Oral airway Placement Confirmation: ETT inserted through vocal cords under direct vision, positive ETCO2 and breath sounds checked- equal and bilateral Secured at: 22 cm Tube secured with: Tape Dental Injury: Teeth and Oropharynx as per pre-operative assessment

## 2022-01-10 NOTE — Anesthesia Postprocedure Evaluation (Signed)
Anesthesia Post Note  Patient: Albert Knight  Procedure(s) Performed: ATRIAL FIBRILLATION ABLATION     Patient location during evaluation: Cath Lab Anesthesia Type: General Level of consciousness: awake Pain management: pain level controlled Vital Signs Assessment: post-procedure vital signs reviewed and stable Respiratory status: spontaneous breathing, nonlabored ventilation, respiratory function stable and patient connected to nasal cannula oxygen Cardiovascular status: blood pressure returned to baseline and stable Postop Assessment: no apparent nausea or vomiting Anesthetic complications: no   There were no known notable events for this encounter.  Last Vitals:  Vitals:   01/08/22 1645 01/08/22 1655  BP:    Pulse: 62 68  Resp: 15 20  Temp:    SpO2: 95% 94%    Last Pain:  Vitals:   01/08/22 1334  TempSrc:   PainSc: 0-No pain                 Bence Trapp P Brooks Kinnan

## 2022-01-11 ENCOUNTER — Encounter (HOSPITAL_COMMUNITY): Payer: Self-pay | Admitting: Cardiology

## 2022-01-14 ENCOUNTER — Telehealth: Payer: Self-pay | Admitting: Cardiology

## 2022-01-14 NOTE — Telephone Encounter (Signed)
Patient asked how long he has to take the eliquis after his procedure. Advise at least 3 months, so until he sees Dr. Quentin Ore in Dec. Talked about his insurance changing at the end of September. Advised him to let us know if he needs any assistance when that occurs.

## 2022-01-14 NOTE — Telephone Encounter (Signed)
Pt c/o medication issue:  1. Name of Medication: apixaban (ELIQUIS) 5 MG TABS tablet  2. How are you currently taking this medication (dosage and times per day)? Take 1 tablet (5 mg total) by mouth 2 (two) times daily.  3. Are you having a reaction (difficulty breathing--STAT)? no  4. What is your medication issue? Calling with question concerning the medication

## 2022-01-15 ENCOUNTER — Other Ambulatory Visit (HOSPITAL_BASED_OUTPATIENT_CLINIC_OR_DEPARTMENT_OTHER): Payer: Self-pay

## 2022-01-22 ENCOUNTER — Other Ambulatory Visit (HOSPITAL_COMMUNITY): Payer: Self-pay

## 2022-01-22 ENCOUNTER — Other Ambulatory Visit (HOSPITAL_BASED_OUTPATIENT_CLINIC_OR_DEPARTMENT_OTHER): Payer: Self-pay

## 2022-01-27 ENCOUNTER — Other Ambulatory Visit (HOSPITAL_BASED_OUTPATIENT_CLINIC_OR_DEPARTMENT_OTHER): Payer: Self-pay

## 2022-01-29 ENCOUNTER — Other Ambulatory Visit (HOSPITAL_COMMUNITY): Payer: Self-pay

## 2022-01-29 ENCOUNTER — Other Ambulatory Visit (HOSPITAL_BASED_OUTPATIENT_CLINIC_OR_DEPARTMENT_OTHER): Payer: Self-pay

## 2022-02-05 ENCOUNTER — Ambulatory Visit (HOSPITAL_COMMUNITY): Payer: 59 | Admitting: Physician Assistant

## 2022-04-07 ENCOUNTER — Ambulatory Visit: Payer: 59 | Admitting: Cardiology

## 2024-01-23 IMAGING — DX DG CHEST 2V
2 series · 2 of 2 positions shown · non-contrast
Comparison: Radiographs 03/19/2017.

CLINICAL DATA: Chest pain.  Atrial fibrillation.

EXAM:
CHEST - 2 VIEW

[chest pa]
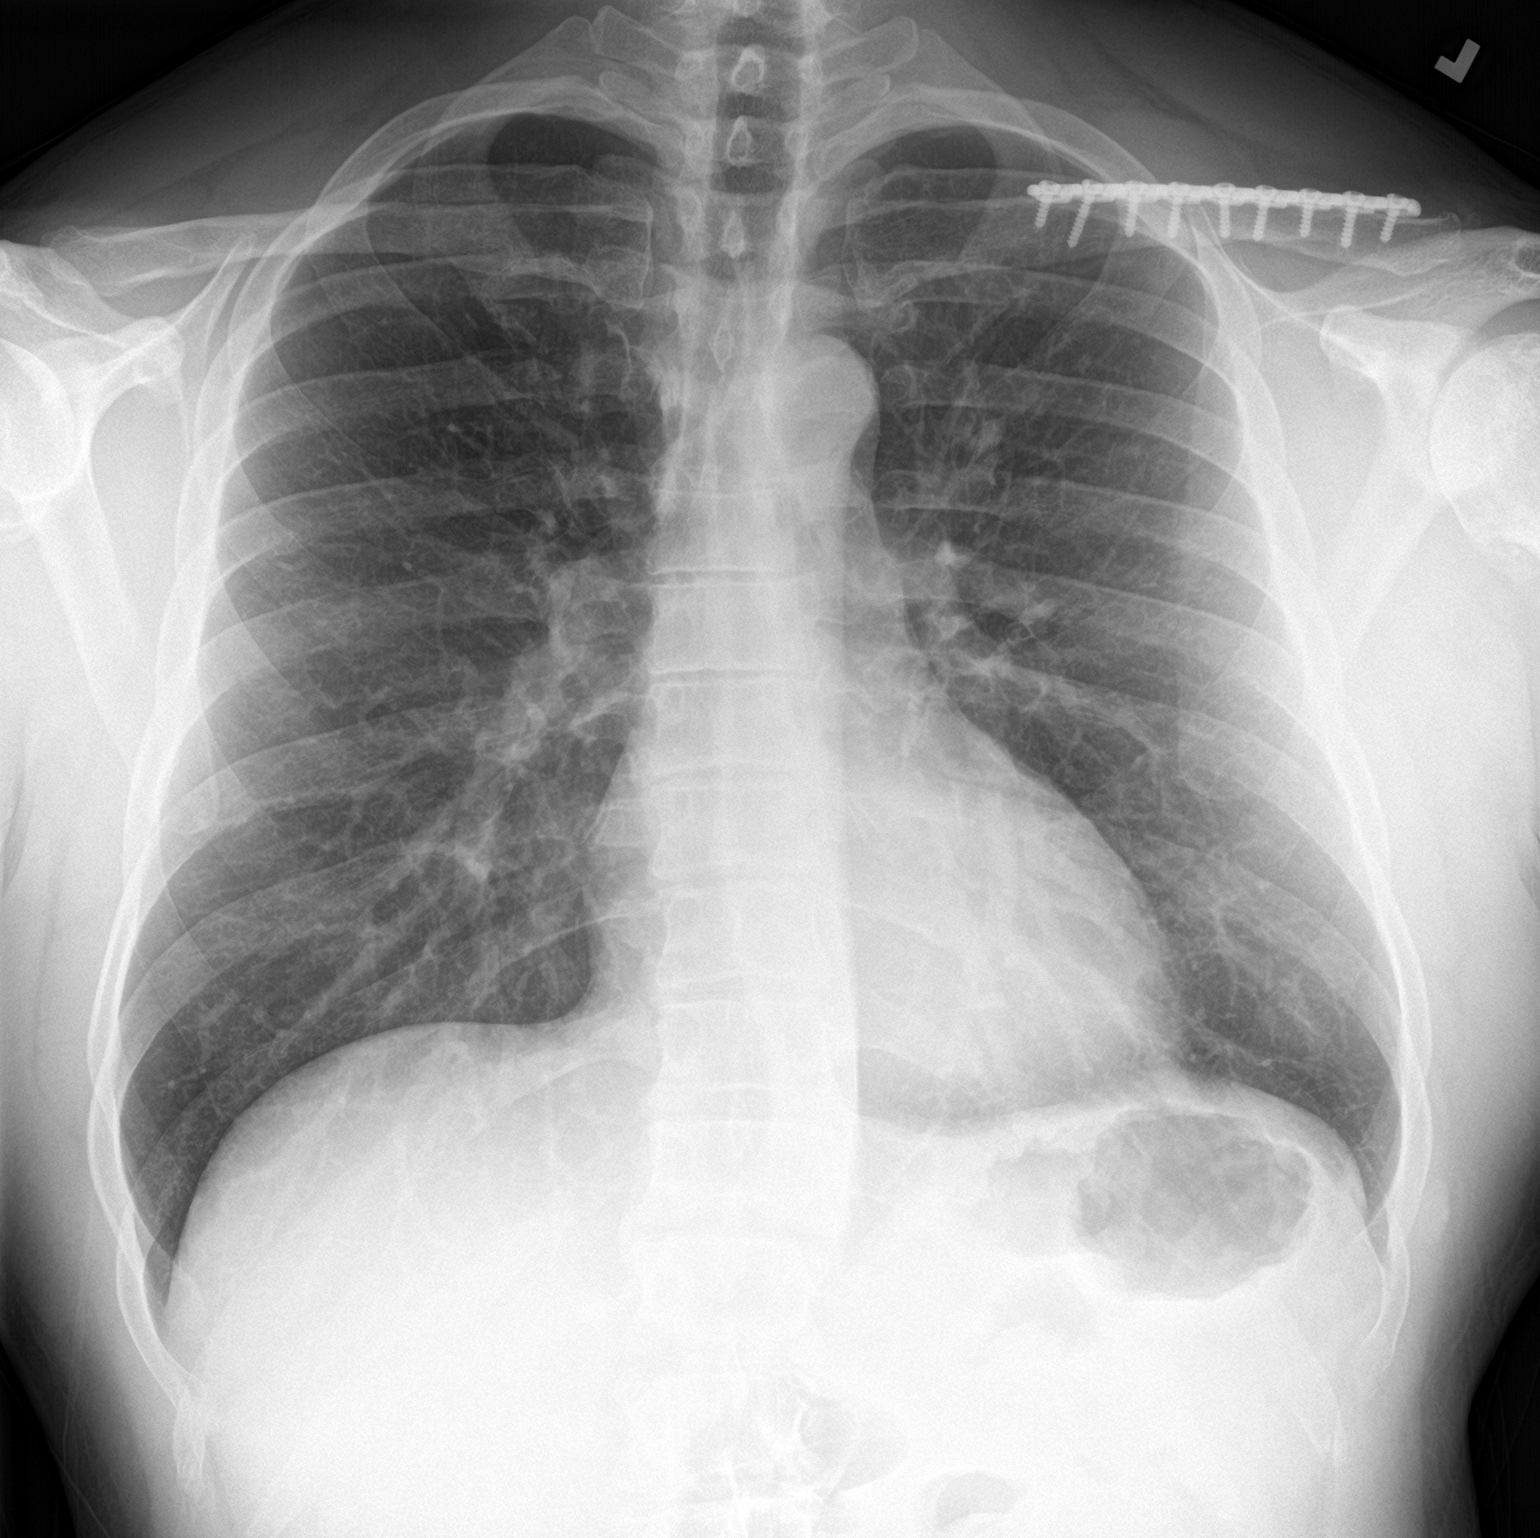

[chest lat]
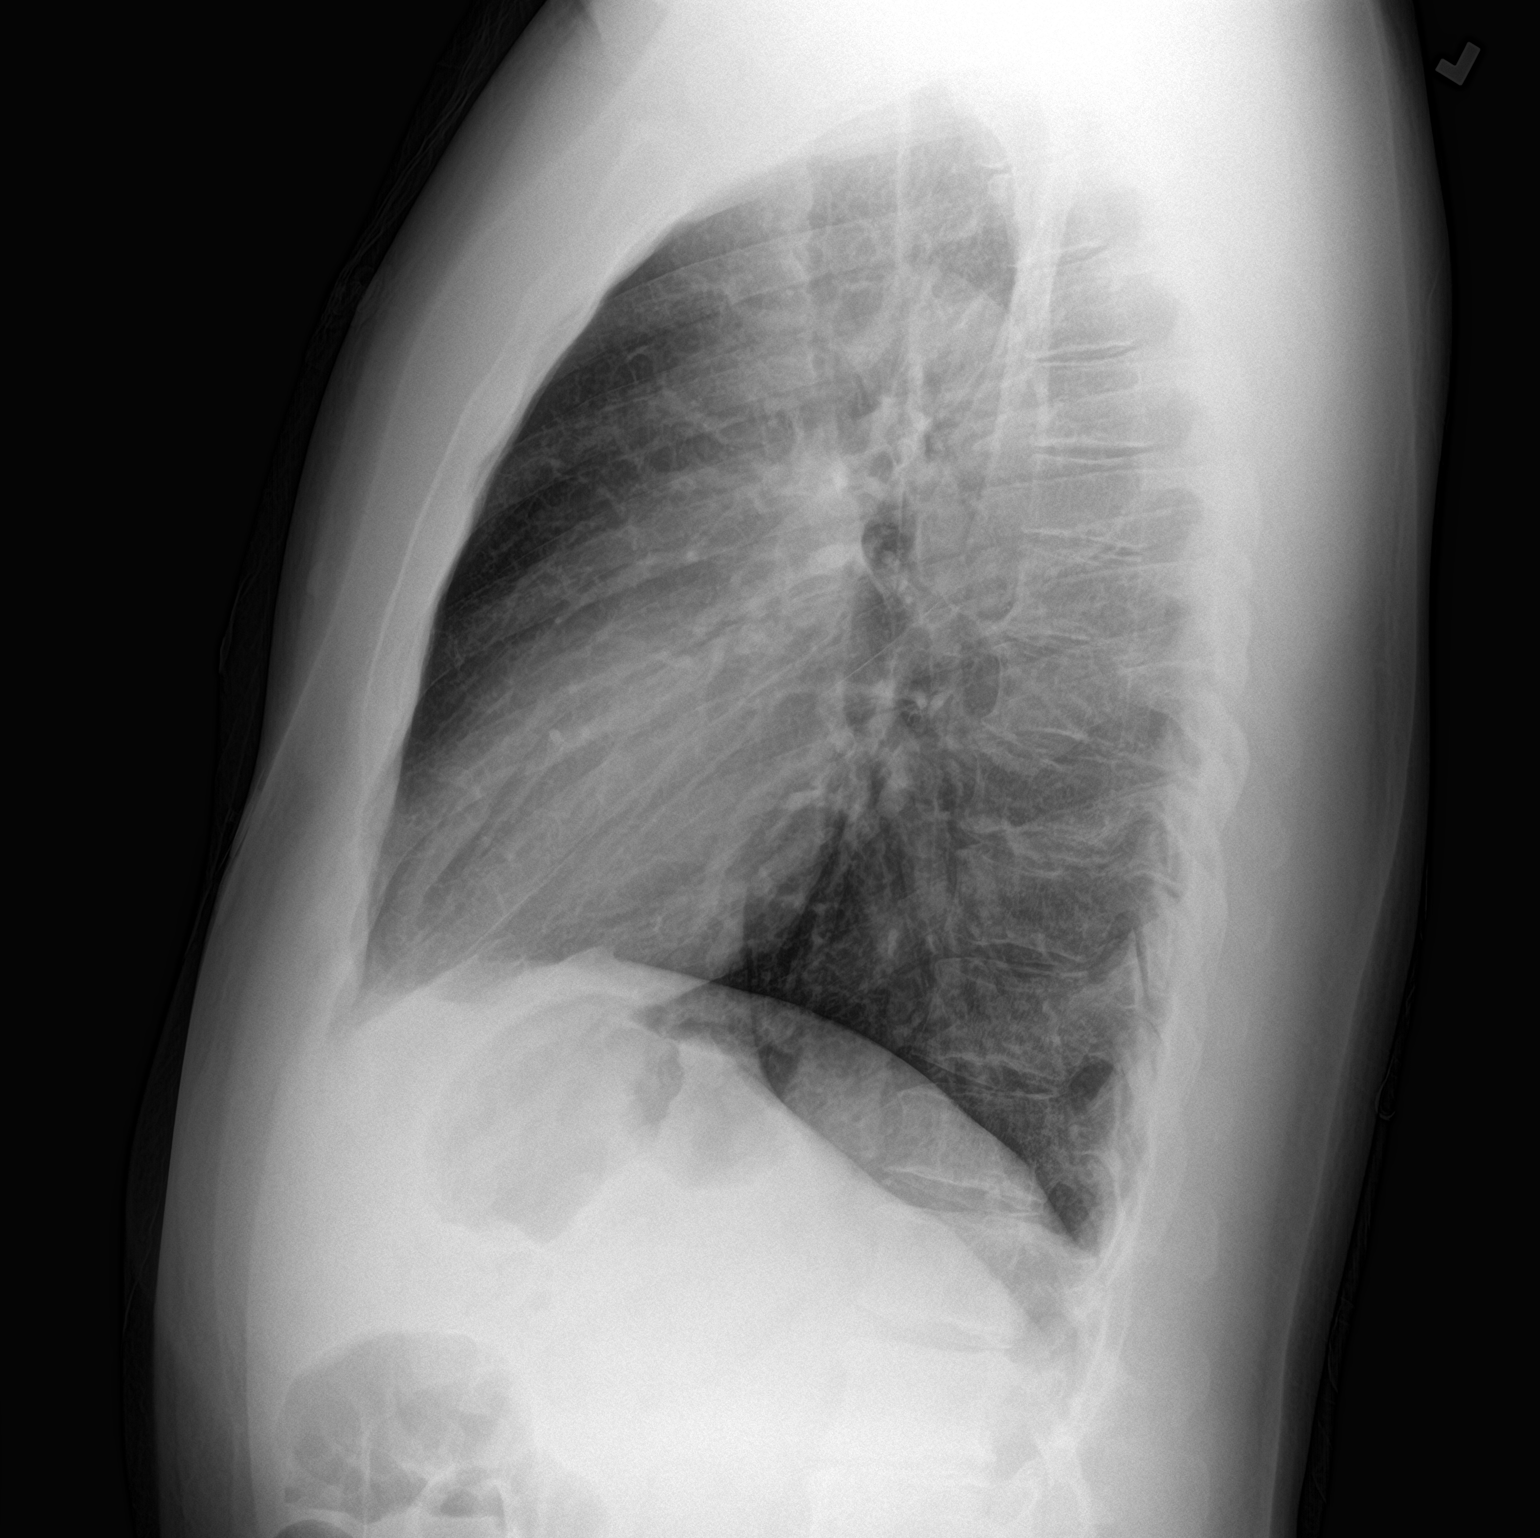

[2 of 2 positions shown; findings below may reference images not displayed]

FINDINGS: The heart size and mediastinal contours are normal. The lungs are
clear. There is no pleural effusion or pneumothorax. No acute
osseous findings are identified. Previous left clavicular ORIF.
IMPRESSION: Resolution of previously demonstrated left upper lobe airspace
disease. No acute cardiopulmonary process.
